# Patient Record
Sex: Female | Born: 1990 | Race: Black or African American | Hispanic: No | Marital: Single | Smoking: Never smoker
Health system: Southern US, Community
[De-identification: ages and names within clinical notes are randomized; demographics above are authoritative.]

## PROBLEM LIST (undated history)

## (undated) DIAGNOSIS — Z789 Other specified health status: Secondary | ICD-10-CM

## (undated) HISTORY — PX: SMALL INTESTINE SURGERY: SHX150

---
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
## 2013-09-27 ENCOUNTER — Emergency Department: Payer: Self-pay | Admitting: Emergency Medicine

## 2015-02-14 ENCOUNTER — Ambulatory Visit (INDEPENDENT_AMBULATORY_CARE_PROVIDER_SITE_OTHER): Payer: No Typology Code available for payment source

## 2015-02-14 ENCOUNTER — Ambulatory Visit (INDEPENDENT_AMBULATORY_CARE_PROVIDER_SITE_OTHER): Payer: No Typology Code available for payment source | Admitting: Podiatry

## 2015-02-14 ENCOUNTER — Encounter: Payer: Self-pay | Admitting: Podiatry

## 2015-02-14 VITALS — Ht 67.0 in | Wt 165.0 lb

## 2015-02-14 DIAGNOSIS — M2040 Other hammer toe(s) (acquired), unspecified foot: Secondary | ICD-10-CM

## 2015-02-14 DIAGNOSIS — M205X9 Other deformities of toe(s) (acquired), unspecified foot: Secondary | ICD-10-CM

## 2015-02-14 DIAGNOSIS — M21629 Bunionette of unspecified foot: Secondary | ICD-10-CM

## 2015-02-14 NOTE — Patient Instructions (Signed)
Hammer Toes Hammer toes is a condition in which one or more of your toes is permanently flexed. CAUSES  This happens when a muscle imbalance or abnormal bone length makes your small toes buckle. This causes the toe joint to contract and the strong cord-like bands that attach muscles to the bones (tendons) in your toes to shorten.  SIGNS AND SYMPTOMS  Common symptoms of flexible hammer toes include:   A buildup of skin cells (corns). Corns occur where boney bumps come in frequent contact with hard surfaces. For example, where your shoes press and rub.  Irritation.  Inflammation.  Pain.  Limited motion in your toes. DIAGNOSIS  Hammer toes are diagnosed through a physical exam of your toes. During the exam, your health care provider may try to reproduce your symptoms by manipulating your foot. Often, X-ray exams are done to determine the degree of deformity and to make sure that the cause is not a fracture.  TREATMENT  Hammer toes can be treated with corrective surgery. There are several types of surgical procedures that can treat hammer toes. The most common procedures include:  Arthroplasty--A portion of the joint is surgically removed and your toe is straightened. The gap fills in with fibrous tissue. This procedure helps treat pain and deformity and helps restore function.  Fusion--Cartilage between the two bones of the affected joint is taken out and the bones fuse together into one longer bone. This helps keep your toe stable and reduces pain but leaves your toe stiff, yet straight.  Implantation--A portion of your bone is removed and replaced with an implant to restore motion.  Flexor tendon transfers--This procedure repositions the tendons that curl the toes down (flexor tendons). This may be done to release the deforming force that causes your toe to buckle. Several of these procedures require fixing your toe with a pin that is visible at the tip of your toe. The pin keeps the toe  straight during healing. Your health care provider will remove the pin usually within 4-8 weeks after the procedure.  Document Released: 09/27/2000 Document Revised: 10/05/2013 Document Reviewed: 06/07/2013 [employer] Patient Information 2015 [employer]. This information is not intended to replace advice given to you by your health care provider. Make sure you discuss any questions you have with your health care provider.  

## 2015-02-14 NOTE — Progress Notes (Signed)
Subjective:     Patient ID: Regina Fischer, female   DOB: June 13, 1991, 24 y.o.   MRN: [id number]  HPI 24 year old female presented the office today with complaints of painful "corns" to the top of her toes on both feet. She also states she has a painful bump on the outside of her foot behind the little toe. She states she has pain to the areas particularly with shoe gear. She said no prior treatment. Denies any history of injury or trauma. Denies any swelling or redness. Denies any numbness or tingling. Review of Systems  All other systems reviewed and are negative.      Objective:   Physical Exam AAO x3, NAD DP/PT pulses palpable bilaterally, CRT less than 3 seconds Protective sensation intact with Simms Weinstein monofilament, vibratory sensation intact, Achilles tendon reflex intact There are hammertoe contractures bilateral lesser digits of 2 through 5 with adductovarus of the third, fourth, fifth digits. The contractures. Be more prominent on the right side than the left side. Upon weightbearing the hammertoe contractures do straighten somewhat on the left side however they do remain on the right side. There is hyperpigmented areas on the dorsal aspect of the PIPJ and the PIPJ 23 and 4 and along the PIPJ of the fifth digit. These areas appear to be from shoe irritation. She states these areas of the areas of tenderness particularly shoes. She also has tenderness over the site of a tailors' bunion with the right  Worse then the left. There is no areas of pinpoint bony tenderness or pain the vibratory sensation.  No areas of tenderness to bilateral lower extremities. MMT 5/5, ROM WNL.  No open lesions or pre-ulcerative lesions.  No overlying edema, erythema, increase in warmth to bilateral lower extremities.  No pain with calf compression, swelling, warmth, erythema bilaterally.      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    Plan:     -X-rays were obtained and reviewed with the patient. -Treatment options were discussed include alternatives, risks, complications. I discussed both conservative and surgical options. -Conservative treatment discussed with patient include sugar modifications, will higher toe box, padding, offloading. She will try these treatments first before proceed with surgical intervention. -I discussed with her surgical intervention of the digits as well as tailors bunionectomy. Discussed the patient postoperative course. -Follow-up as needed. Call the office with any questions, concerns, change in symptoms.

## 2015-02-21 NOTE — H&P (Signed)
L&D Evaluation:  History Expanded:   HPI 24 yo G1P0 at 40 weeks for delivery, [facility].    Gravida 1    Term 0    Blood Type O positive    Group B Strep Results (Result >5wks must be treated as unknown) positive    Patient's Medical History No Chronic Illness    Patient's Surgical History Bowel surgery as infant    Medications Pre Natal Vitamins    Allergies NKDA    Social History none    Family History Non-Contributory   ROS:   ROS All systems were reviewed.  HEENT, CNS, GI, GU, Respiratory, CV, Renal and Musculoskeletal systems were found to be normal.   Exam:   Vital Signs stable    General no apparent distress    Mental Status clear    Chest clear    Heart normal sinus rhythm, no murmur/gallop/rubs    Abdomen gravid, non-tender    Estimated Fetal Weight Average for gestational age    Back no CVAT    Edema no edema    Pelvic no external lesions, 4/80    Mebranes Intact    FHT normal rate with no decels    Ucx regular    Skin dry   Impression:   Impression early labor   Plan:   Plan EFM/NST, monitor contractions and for cervical change, antibiotics for GBBS prophylaxis, fluids    Comments Plan AROM after ABX in   Electronic Signatures: Letitia LibraHarris, Cadan Maggart Paul (MD)  (Signed [id number]-Feb-13 15:14)  Authored: L&D Evaluation   Last Updated: 22-Feb-13 15:14 by Letitia LibraHarris, Tom Macpherson Paul (MD)

## 2015-08-27 ENCOUNTER — Encounter: Payer: Self-pay | Admitting: Emergency Medicine

## 2015-08-27 ENCOUNTER — Emergency Department
Admission: EM | Admit: 2015-08-27 | Discharge: 2015-08-27 | Disposition: A | Discharge status: Home / Self Care | Payer: No Typology Code available for payment source | Attending: Emergency Medicine | Admitting: Emergency Medicine

## 2015-08-27 ENCOUNTER — Emergency Department: Payer: No Typology Code available for payment source

## 2015-08-27 DIAGNOSIS — Y9389 Activity, other specified: Secondary | ICD-10-CM | POA: Insufficient documentation

## 2015-08-27 DIAGNOSIS — S93402A Sprain of unspecified ligament of left ankle, initial encounter: Secondary | ICD-10-CM | POA: Insufficient documentation

## 2015-08-27 DIAGNOSIS — Y998 Other external cause status: Secondary | ICD-10-CM | POA: Diagnosis not present

## 2015-08-27 DIAGNOSIS — Y92410 Unspecified street and highway as the place of occurrence of the external cause: Secondary | ICD-10-CM | POA: Insufficient documentation

## 2015-08-27 DIAGNOSIS — S99912A Unspecified injury of left ankle, initial encounter: Secondary | ICD-10-CM | POA: Diagnosis present

## 2015-08-27 DIAGNOSIS — S161XXA Strain of muscle, fascia and tendon at neck level, initial encounter: Secondary | ICD-10-CM | POA: Diagnosis not present

## 2015-08-27 MED ORDER — METHOCARBAMOL 500 MG PO TABS
1000.0000 mg | ORAL_TABLET | Freq: Once | ORAL | Status: AC
  Administered 2015-08-27: 1000 mg via ORAL
  Filled 2015-08-27: qty 2

## 2015-08-27 MED ORDER — IBUPROFEN 800 MG PO TABS
800.0000 mg | ORAL_TABLET | Freq: Once | ORAL | Status: AC
  Administered 2015-08-27: 800 mg via ORAL
  Filled 2015-08-27: qty 1

## 2015-08-27 MED ORDER — IBUPROFEN 800 MG PO TABS: 800.0000 mg | ORAL_TABLET | Freq: Three times a day (TID) | ORAL | Status: DC | PRN

## 2015-08-27 MED ORDER — METHOCARBAMOL 750 MG PO TABS: 750.0000 mg | ORAL_TABLET | Freq: Four times a day (QID) | ORAL | Status: DC
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
## 2015-08-27 NOTE — Discharge Instructions (Signed)
For ankle support for 3-5 days as needed.

## 2015-10-15 NOTE — L&D Delivery Note (Addendum)
Delivery Note At 10:49 AM a viable and healthy female "Regina Fischer" was delivered via Vaginal, Spontaneous Delivery (Presentation: LOA ).  APGAR: 8, 9; weight  .   Placenta status: spontaneous and 3V Cord:  with the following complications: short cord  Anesthesia:  epidural Episiotomy:  none Lacerations:  Bilateral labial that required no stiched Est. Blood Loss (mL):  300  Mom to postpartum.  Baby to Couplet care / Skin to Skin.  She was admitted in active labor. SROM in early morning for clear fluid. Intact perineum, no concerns, beautiful delivery. Baby with short cord so delayed clamping at perineum and FOB cut the cord. Baby crying and vigorous and up to maternal abdomen.  Christeen DouglasBEASLEY, Regina Fischer 09/18/2016, 11:02 AM

## 2016-01-30 ENCOUNTER — Encounter: Payer: Self-pay | Admitting: Emergency Medicine

## 2016-01-30 ENCOUNTER — Emergency Department
Admission: EM | Admit: 2016-01-30 | Discharge: 2016-01-30 | Disposition: A | Discharge status: Home / Self Care | Payer: BLUE CROSS/BLUE SHIELD | Attending: Emergency Medicine | Admitting: Emergency Medicine

## 2016-01-30 ENCOUNTER — Emergency Department: Payer: BLUE CROSS/BLUE SHIELD

## 2016-01-30 DIAGNOSIS — Z3A01 Less than 8 weeks gestation of pregnancy: Secondary | ICD-10-CM | POA: Insufficient documentation

## 2016-01-30 DIAGNOSIS — O26891 Other specified pregnancy related conditions, first trimester: Secondary | ICD-10-CM | POA: Insufficient documentation

## 2016-01-30 DIAGNOSIS — Z3490 Encounter for supervision of normal pregnancy, unspecified, unspecified trimester: Secondary | ICD-10-CM

## 2016-01-30 DIAGNOSIS — R1030 Lower abdominal pain, unspecified: Secondary | ICD-10-CM | POA: Insufficient documentation

## 2016-01-30 LAB — POCT PREGNANCY, URINE: PREG TEST UR: POSITIVE — AB

## 2016-01-30 LAB — HCG, QUANTITATIVE, PREGNANCY: HCG, BETA CHAIN, QUANT, S: 54202 m[IU]/mL — AB (ref <5)

## 2016-01-30 LAB — CBC WITH DIFFERENTIAL/PLATELET
BASOS ABS: 0 10*3/uL (ref 0–0.1)
BASOS PCT: 0 %
Eosinophils Absolute: 0.1 10*3/uL (ref 0–0.7)
Eosinophils Relative: 1 %
HEMATOCRIT: 36.3 % (ref 35.0–47.0)
HEMOGLOBIN: 12.2 g/dL (ref 12.0–16.0)
Lymphocytes Relative: 37 %
Lymphs Abs: 1.9 10*3/uL (ref 1.0–3.6)
MCH: 28.9 pg (ref 26.0–34.0)
MCHC: 33.5 g/dL (ref 32.0–36.0)
MCV: 86.4 fL (ref 80.0–100.0)
MONO ABS: 0.3 10*3/uL (ref 0.2–0.9)
Monocytes Relative: 7 %
Neutro Abs: 2.8 10*3/uL (ref 1.4–6.5)
Neutrophils Relative %: 55 %
Platelets: 186 10*3/uL (ref 150–440)
RBC: 4.2 MIL/uL (ref 3.80–5.20)
RDW: 13.6 % (ref 11.5–14.5)
WBC: 5.1 10*3/uL (ref 3.6–11.0)

## 2016-01-30 MED ORDER — ACETAMINOPHEN 325 MG PO TABS
650.0000 mg | ORAL_TABLET | Freq: Once | ORAL | Status: AC
  Administered 2016-01-30: 650 mg via ORAL
  Filled 2016-01-30: qty 2

## 2016-01-30 NOTE — ED Notes (Signed)

## 2016-01-30 NOTE — ED Notes (Signed)
Reports [redacted] wks pregnant, abd cramping.  No bleeding.

## 2016-01-30 NOTE — ED Provider Notes (Signed)
Time Seen: Approximately 1850  I have reviewed the triage notes  Chief Complaint: Abdominal Cramping   History of Present Illness: Regina Fischer is a 25 y.o. female who is now gravida 2 para 1 and was recently had a positive pregnancy test. Patient states that she's been having some lower abdominal cramping without any vaginal discharge or bleeding. Had any prenatal evaluation up to this point. She denies any nausea, vomiting, fever. She denies any dysuria, hematuria, urinary frequency   History reviewed. No pertinent past medical history.  There are no active problems to display for this patient.   History reviewed. No pertinent past surgical history.  History reviewed. No pertinent past surgical history.  Current Outpatient Rx  Name  Route  Sig  Dispense  Refill  . ibuprofen (ADVIL,MOTRIN) 800 MG tablet   Oral   Take 1 tablet (800 mg total) by mouth every 8 (eight) hours as needed for moderate pain.   15 tablet   0   . methocarbamol (ROBAXIN-750) 750 MG tablet   Oral   Take 1 tablet (750 mg total) by mouth 4 (four) times daily.   20 tablet   0     Allergies:  Review of patient's allergies indicates no known allergies.  Family History: No family history on file.  Social History: Social History  Substance Use Topics  . Smoking status: Never Smoker   . Smokeless tobacco: None  . Alcohol Use: No     Review of Systems:   10 point review of systems was performed and was otherwise negative:  Constitutional: No fever Eyes: No visual disturbances ENT: No sore throat, ear pain Cardiac: No chest pain Respiratory: No shortness of breath, wheezing, or stridor Abdomen: Mild lower middle quadrant abdominal pain Endocrine: No weight loss, No night sweats Extremities: No peripheral edema, cyanosis Skin: No rashes, easy bruising Neurologic: No focal weakness, trouble with speech or swollowing Urologic: No dysuria, Hematuria, or urinary frequency   Physical  Exam:  ED Triage Vitals  Enc Vitals Group     BP 01/30/16 1756 123/66 mmHg     Pulse Rate 01/30/16 1756 60     Resp 01/30/16 1756 16     Temp 01/30/16 1756 98.6 F (37 C)     Temp Source 01/30/16 1756 Oral     SpO2 01/30/16 1756 100 %     Weight 01/30/16 1756 175 lb (79.379 kg)     Height 01/30/16 1756  (1.702 m)     Head Cir --      Peak Flow --      Pain Score 01/30/16 1757 4     Pain Loc --      Pain Edu? --      Excl. in GC? --     General: Awake , Alert , and Oriented times 3; GCS 15 Head: Normal cephalic , atraumatic Eyes: Pupils equal , round, reactive to light Nose/Throat: No nasal drainage, patent upper airway without erythema or exudate.  Neck: Supple, Full range of motion, No anterior adenopathy or palpable thyroid masses Lungs: Clear to ascultation without wheezes , rhonchi, or rales Heart: Regular rate, regular rhythm without murmurs , gallops , or rubs Abdomen: Soft, non tender without rebound, guarding , or rigidity; bowel sounds positive and symmetric in all 4 quadrants. No organomegaly .        Extremities: 2 plus symmetric pulses. No edema, clubbing or cyanosis Neurologic: normal ambulation, Motor symmetric without deficits, sensory intact Skin: warm,  dry, no rashes   Labs:   All laboratory work was reviewed including any pertinent negatives or positives listed below:  Labs Reviewed  HCG, QUANTITATIVE, PREGNANCY - Abnormal; Notable for the following:    hCG, Beta Chain, Quant, S [id number] (*)    All other components within normal limits  POCT PREGNANCY, URINE - Abnormal; Notable for the following:    Preg Test, Ur POSITIVE (*)    All other components within normal limits  CBC WITH DIFFERENTIAL/PLATELET  POC URINE PREG, ED  ABO/RH  Laboratory work was reviewed and showed no clinically significant abnormalities.   Radiology: *  EXAM: OBSTETRIC <14 WK US AND TRANSVAGINAL OB US  TECHNIQUE: Both transabdominal and transvaginal ultrasound  examinations were performed for complete evaluation of the gestation as well as the maternal uterus, adnexal regions, and pelvic cul-de-sac. Transvaginal technique was performed to assess early pregnancy.  COMPARISON: None.  FINDINGS: Intrauterine gestational sac: Single.  Yolk sac: Visualized.  Embryo: Visualized.  Cardiac Activity: Visualized.  Heart Rate: 115 bpm  CRL: 9.4 mm  7 w  0 d         US EDC: September 17, 2016.  Subchorionic hemorrhage: None visualized.  Maternal uterus/adnexae: Ovaries appear normal. No free fluid is noted.  IMPRESSION: Single live intrauterine gestation of 7 weeks 0 days.   Electronically Signed  I personally reviewed the radiologic studies   P ED Course: Patient's stay was uneventful and her workup was primarily around making sure her pregnancies in the right location. No evidence on clinical exam and ultrasound of an ectopic pregnancy at this time. Patient was advised over results and follow up with [facility] which [facility]. Return here if she develops a fever, persistent vomiting, vaginal bleeding or any other new concerns. Tylenol for pain and drink plenty of fluids   Assessment: * First trimester pregnancy   Final Clinical Impression:   Final diagnoses:  Pregnancy     Plan:  Outpatient Patient was advised to return immediately if condition worsens. Patient was advised to follow up with their primary care physician or other specialized physicians involved in their outpatient care. The patient and/or family member/power of attorney had laboratory results reviewed at the bedside. All questions and concerns were addressed and appropriate discharge instructions were distributed by the nursing staff.             Jennye MoccasinBrian S Quigley, MD 01/30/16 2016

## 2016-01-30 NOTE — Discharge Instructions (Signed)
First Trimester of Pregnancy °The first trimester of pregnancy is from week 1 until the end of week 12 (months 1 through 3). A week after a sperm fertilizes an egg, the egg will implant on the wall of the uterus. This embryo will begin to develop into a baby. Genes from you and your partner are forming the baby. The female genes determine whether the baby is a boy or a girl. At 6-8 weeks, the eyes and face are formed, and the heartbeat can be seen on ultrasound. At the end of 12 weeks, all the baby's organs are formed.  °Now that you are pregnant, you will want to do everything you can to have a healthy baby. Two of the most important things are to get good prenatal care and to follow your health care provider's instructions. Prenatal care is all the medical care you receive before the baby's birth. This care will help prevent, find, and treat any problems during the pregnancy and childbirth. °BODY CHANGES °Your body goes through many changes during pregnancy. The changes vary from woman to woman.  °· You may gain or lose a couple of pounds at first. °· You may feel sick to your stomach (nauseous) and throw up (vomit). If the vomiting is uncontrollable, call your health care provider. °· You may tire easily. °· You may develop headaches that can be relieved by medicines approved by your health care provider. °· You may urinate more often. Painful urination may mean you have a bladder infection. °· You may develop heartburn as a result of your pregnancy. °· You may develop constipation because certain hormones are causing the muscles that push waste through your intestines to slow down. °· You may develop hemorrhoids or swollen, bulging veins (varicose veins). °· Your breasts may begin to grow larger and become tender. Your nipples may stick out more, and the tissue that surrounds them (areola) may become darker. °· Your gums may bleed and may be sensitive to brushing and flossing. °· Dark spots or blotches (chloasma,  mask of pregnancy) may develop on your face. This will likely fade after the baby is born. °· Your menstrual periods will stop. °· You may have a loss of appetite. °· You may develop cravings for certain kinds of food. °· You may have changes in your emotions from day to day, such as being excited to be pregnant or being concerned that something may go wrong with the pregnancy and baby. °· You may have more vivid and strange dreams. °· You may have changes in your hair. These can include thickening of your hair, rapid growth, and changes in texture. Some women also have hair loss during or after pregnancy, or hair that feels dry or thin. Your hair will most likely return to normal after your baby is born. °WHAT TO EXPECT AT YOUR PRENATAL VISITS °During a routine prenatal visit: °· You will be weighed to make sure you and the baby are growing normally. °· Your blood pressure will be taken. °· Your abdomen will be measured to track your baby's growth. °· The fetal heartbeat will be listened to starting around week 10 or 12 of your pregnancy. °· Test results from any previous visits will be discussed. °Your health care provider may ask you: °· How you are feeling. °· If you are feeling the baby move. °· If you have had any abnormal symptoms, such as leaking fluid, bleeding, severe headaches, or abdominal cramping. °· If you are using any tobacco products,   including cigarettes, chewing tobacco, and electronic cigarettes. °· If you have any questions. °Other tests that may be performed during your first trimester include: °· Blood tests to find your blood type and to check for the presence of any previous infections. They will also be used to check for low iron levels (anemia) and Rh antibodies. Later in the pregnancy, blood tests for diabetes will be done along with other tests if problems develop. °· Urine tests to check for infections, diabetes, or protein in the urine. °· An ultrasound to confirm the proper growth  and development of the baby. °· An amniocentesis to check for possible genetic problems. °· Fetal screens for spina bifida and Down syndrome. °· You may need other tests to make sure you and the baby are doing well. °· HIV (human immunodeficiency virus) testing. Routine prenatal testing includes screening for HIV, unless you choose not to have this test. °HOME CARE INSTRUCTIONS  °Medicines °· Follow your health care provider's instructions regarding medicine use. Specific medicines may be either safe or unsafe to take during pregnancy. °· Take your prenatal vitamins as directed. °· If you develop constipation, try taking a stool softener if your health care provider approves. °Diet °· Eat regular, well-balanced meals. Choose a variety of foods, such as meat or vegetable-based protein, fish, milk and low-fat dairy products, vegetables, fruits, and whole grain breads and cereals. Your health care provider will help you determine the amount of weight gain that is right for you. °· Avoid raw meat and uncooked cheese. These carry germs that can cause birth defects in the baby. °· Eating four or five small meals rather than three large meals a day may help relieve nausea and vomiting. If you start to feel nauseous, eating a few soda crackers can be helpful. Drinking liquids between meals instead of during meals also seems to help nausea and vomiting. °· If you develop constipation, eat more high-fiber foods, such as fresh vegetables or fruit and whole grains. Drink enough fluids to keep your urine clear or pale yellow. °Activity and Exercise °· Exercise only as directed by your health care provider. Exercising will help you: °¨ Control your weight. °¨ Stay in shape. °¨ Be prepared for labor and delivery. °· Experiencing pain or cramping in the lower abdomen or low back is a good sign that you should stop exercising. Check with your health care provider before continuing normal exercises. °· Try to avoid standing for long  periods of time. Move your legs often if you must stand in one place for a long time. °· Avoid heavy lifting. °· Wear low-heeled shoes, and practice good posture. °· You may continue to have sex unless your health care provider directs you otherwise. °Relief of Pain or Discomfort °· Wear a good support bra for breast tenderness.   °· Take warm sitz baths to soothe any pain or discomfort caused by hemorrhoids. Use hemorrhoid cream if your health care provider approves.   °· Rest with your legs elevated if you have leg cramps or low back pain. °· If you develop varicose veins in your legs, wear support hose. Elevate your feet for 15 minutes, 3-4 times a day. Limit salt in your diet. °Prenatal Care °· Schedule your prenatal visits by the twelfth week of pregnancy. They are usually scheduled monthly at first, then more often in the last 2 months before delivery. °· Write down your questions. Take them to your prenatal visits. °· Keep all your prenatal visits as directed by your   health care provider. °Safety °· Wear your seat belt at all times when driving. °· Make a list of emergency phone numbers, including numbers for family, friends, the hospital, and police and fire departments. °General Tips °· Ask your health care provider for a referral to a local prenatal education class. Begin classes no later than at the beginning of month 6 of your pregnancy. °· Ask for help if you have counseling or nutritional needs during pregnancy. Your health care provider can offer advice or refer you to specialists for help with various needs. °· Do not use hot tubs, steam rooms, or saunas. °· Do not douche or use tampons or scented sanitary pads. °· Do not cross your legs for long periods of time. °· Avoid cat litter boxes and soil used by cats. These carry germs that can cause birth defects in the baby and possibly loss of the fetus by miscarriage or stillbirth. °· Avoid all smoking, herbs, alcohol, and medicines not prescribed by  your health care provider. Chemicals in these affect the formation and growth of the baby. °· Do not use any tobacco products, including cigarettes, chewing tobacco, and electronic cigarettes. If you need help quitting, ask your health care provider. You may receive counseling support and other resources to help you quit. °· Schedule a dentist appointment. At home, brush your teeth with a soft toothbrush and be gentle when you floss. °SEEK MEDICAL CARE IF:  °· You have dizziness. °· You have mild pelvic cramps, pelvic pressure, or nagging pain in the abdominal area. °· You have persistent nausea, vomiting, or diarrhea. °· You have a bad smelling vaginal discharge. °· You have pain with urination. °· You notice increased swelling in your face, hands, legs, or ankles. °SEEK IMMEDIATE MEDICAL CARE IF:  °· You have a fever. °· You are leaking fluid from your vagina. °· You have spotting or bleeding from your vagina. °· You have severe abdominal cramping or pain. °· You have rapid weight gain or loss. °· You vomit blood or material that looks like coffee grounds. °· You are exposed to German measles and have never had them. °· You are exposed to fifth disease or chickenpox. °· You develop a severe headache. °· You have shortness of breath. °· You have any kind of trauma, such as from a fall or a car accident. °  °This information is not intended to replace advice given to you by your health care provider. Make sure you discuss any questions you have with your health care provider. °  °Document Released: 09/24/2001 Document Revised: 10/21/2014 Document Reviewed: 08/10/2013 °Elsevier Interactive Patient Education ©2016 [employer] ° ° °Please return immediately if condition worsens. Please contact her primary physician or the physician you were given for referral. If you have any specialist physicians involved in her treatment and plan please also contact them. Thank you for using [facility]. ° °

## 2016-01-31 LAB — ABO/RH: ABO/RH(D): O POS

## 2016-02-14 ENCOUNTER — Other Ambulatory Visit: Payer: Self-pay | Admitting: Obstetrics and Gynecology

## 2016-02-14 DIAGNOSIS — Z369 Encounter for antenatal screening, unspecified: Secondary | ICD-10-CM

## 2016-03-04 ENCOUNTER — Ambulatory Visit
Admission: RE | Admit: 2016-03-04 | Discharge: 2016-03-04 | Disposition: A | Discharge status: Home / Self Care | Payer: BLUE CROSS/BLUE SHIELD | Source: Ambulatory Visit | Attending: Obstetrics and Gynecology | Admitting: Obstetrics and Gynecology

## 2016-03-04 ENCOUNTER — Ambulatory Visit (HOSPITAL_BASED_OUTPATIENT_CLINIC_OR_DEPARTMENT_OTHER)
Admission: RE | Admit: 2016-03-04 | Discharge: 2016-03-04 | Disposition: A | Discharge status: Home / Self Care | Payer: BLUE CROSS/BLUE SHIELD | Source: Ambulatory Visit

## 2016-03-04 VITALS — BP 130/63 | HR 94 | Temp 97.7°F | Wt 178.0 lb

## 2016-03-04 DIAGNOSIS — Z369 Encounter for antenatal screening, unspecified: Secondary | ICD-10-CM

## 2016-03-04 DIAGNOSIS — Z3481 Encounter for supervision of other normal pregnancy, first trimester: Secondary | ICD-10-CM | POA: Insufficient documentation

## 2016-03-04 DIAGNOSIS — Z36 Encounter for antenatal screening of mother: Secondary | ICD-10-CM

## 2016-03-04 DIAGNOSIS — Z3A12 12 weeks gestation of pregnancy: Secondary | ICD-10-CM | POA: Diagnosis not present

## 2016-03-04 HISTORY — DX: Other specified health status: Z78.9

## 2016-03-04 NOTE — Progress Notes (Addendum)
Referring physician:  [facility] Length of Consultation: 40 minutes   Regina Fischer  was referred to [facility] for genetic counseling to review prenatal screening and testing options.  This note summarizes the information we discussed.    We offered the following routine screening tests for this pregnancy:  First trimester screening, which includes nuchal translucency ultrasound screen and first trimester maternal serum marker screening.  The nuchal translucency has approximately an 80% detection rate for Down syndrome and can be positive for other chromosome abnormalities as well as congenital heart defects.  When combined with a maternal serum marker screening, the detection rate is up to 90% for Down syndrome and up to 97% for trisomy 18.     Maternal serum marker screening, a blood test that measures pregnancy proteins, can provide risk assessments for Down syndrome, trisomy 18, and open neural tube defects (spina bifida, anencephaly). Because it does not directly examine the fetus, it cannot positively diagnose or rule out these problems.  Targeted ultrasound uses high frequency sound waves to create an image of the developing fetus.  An ultrasound is often recommended as a routine means of evaluating the pregnancy.  It is also used to screen for fetal anatomy problems (for example, a heart defect) that might be suggestive of a chromosomal or other abnormality.   Should these screening tests indicate an increased concern, then the following additional testing options would be offered:  The chorionic villus sampling procedure is available for first trimester chromosome analysis.  This involves the withdrawal of a small amount of chorionic villi (tissue from the developing placenta).  Risk of pregnancy loss is estimated to be approximately 1 in 200 to 1 in 100 (0.5 to 1%).  There is approximately a 1% (1 in 100) chance that the CVS chromosome results will be  unclear.  Chorionic villi cannot be tested for neural tube defects.     Amniocentesis involves the removal of a small amount of amniotic fluid from the sac surrounding the fetus with the use of a thin needle inserted through the maternal abdomen and uterus.  Ultrasound guidance is used throughout the procedure.  Fetal cells from amniotic fluid are directly evaluated and > 99.5% of chromosome problems and > 98% of open neural tube defects can be detected. This procedure is generally performed after the 15th week of pregnancy.  The main risks to this procedure include complications leading to miscarriage in less than 1 in 200 cases (0.5%).  As another option for information if the pregnancy is suspected to be an an increased chance for certain chromosome conditions, we also reviewed the availability of cell free fetal DNA testing from maternal blood to determine whether or not the baby may have either Down syndrome, trisomy 12, or trisomy 84.  This test utilizes a maternal blood sample and DNA sequencing technology to isolate circulating cell free fetal DNA from maternal plasma.  The fetal DNA can then be analyzed for DNA sequences that are derived from the three most common chromosomes involved in aneuploidy, chromosomes 13, 18, and 21.  If the overall amount of DNA is greater than the expected level for any of these chromosomes, aneuploidy is suspected.  While we do not consider it a replacement for invasive testing and karyotype analysis, a negative result from this testing would be reassuring, though not a guarantee of a normal chromosome complement for the baby.  An abnormal result is certainly suggestive of an abnormal chromosome complement, though  we would still recommend CVS or amniocentesis to confirm any findings from this testing.  Cystic Fibrosis and Spinal Muscular Atrophy (SMA) screening were also discussed with the patient. Both conditions are recessive, which means that both parents must be  carriers in order to have a child with the disease.  Cystic fibrosis (CF) is one of the most common genetic conditions in persons of Caucasian ancestry.  This condition occurs in approximately 1 in 2,500 Caucasian persons and results in thickened secretions in the lungs, digestive, and reproductive systems.  For a baby to be at risk for having CF, both of the parents must be carriers for this condition.  Approximately 1 in 10325 Caucasian persons is a carrier for CF.  Current carrier testing looks for the most common mutations in the gene for CF and can detect approximately 90% of carriers in the Caucasian population.  This means that the carrier screening can greatly reduce, but cannot eliminate, the chance for an individual to have a child with CF.  If an individual is found to be a carrier for CF, then carrier testing would be available for the partner. As part of [place] newborn screening profile, all babies born in the state of [place] will have a two-tier screening process.  Specimens are first tested to determine the concentration of immunoreactive trypsinogen (IRT).  The top 5% of specimens with the highest IRT values then undergo DNA testing using a panel of over 40 common CF mutations. SMA is a neurodegenerative disorder that leads to atrophy of skeletal muscle and overall weakness.  This condition is also more prevalent in the Caucasian population, with 1 in 40-1 in 60 persons being a carrier and 1 in 6,000-1 in 10,000 children being affected.  There are multiple forms of the disease, with some causing death in infancy to other forms with survival into adulthood.  The genetics of SMA is complex, but carrier screening can detect up to 95% of carriers in the Caucasian population.  Similar to CF, a negative result can greatly reduce, but cannot eliminate, the chance to have a child with SMA.  Because the patient is of African American ancestry, we also reviewed the option of testing for  hemoglobinopathies.   The patient previously had a normal MCV (86) and a negative hemoglobin solubility.  While this reduces the chance that she may be a carrier for sickle cell trait or thalassemia, a hemoglobin electrophoresis should be offered to provide more complete testing for hemoglobinopathies if desired, per ACOG guidelines.  We obtained a detailed family history and pregnancy history.  The family history was reported to be unremarkable for birth defects, mental retardation, recurrent pregnancy loss or known chromosome abnormalities.  Ms. Regina Fischer stated that this is her second pregnancy, the first with her current partner.  She has one healthy 23 year old daughter.  She reported no complications or exposures that would be expected to increase the risk for birth defects.  After consideration of the options, Ms. Regina Fischer elected to proceed with first trimester screening.  An ultrasound was performed at the time of the visit.  The gestational age was consistent with  12 weeks.  Fetal anatomy could not be assessed due to early gestational age.  Please refer to the ultrasound report for details of that study.  Ms. Regina Fischer was encouraged to call with questions or concerns.  We can be contacted at [phone].   Regina Andersoneborah F. Wells, MS, CGC  I was immediately available and supervising.  Camelia Phenes, MD [facility]

## 2016-03-07 ENCOUNTER — Telehealth: Payer: Self-pay | Admitting: Obstetrics and Gynecology

## 2016-03-07 NOTE — Telephone Encounter (Signed)
  Ms. Regina Fischer elected to undergo First Trimester screening on 03/04/2016.  To review, first trimester screening, includes nuchal translucency ultrasound screen and/or first trimester maternal serum marker screening.  The nuchal translucency has approximately an 80% detection rate for Down syndrome and can be positive for other chromosome abnormalities as well as heart defects.  When combined with a maternal serum marker screening, the detection rate is up to 90% for Down syndrome and up to 97% for trisomy 13 and 18.     The results of the First Trimester Nuchal Translucency and Biochemical Screening were within normal range.  The risk for Down syndrome is now estimated to be less than 1 in 10,000.  The risk for Trisomy 13/18 is also estimated to be less than 1 in 10,000.  Should more definitive information be desired, we would offer amniocentesis.  Because we do not yet know the effectiveness of combined first and second trimester screening, we do not recommend a maternal serum screen to assess the chance for chromosome conditions.  However, if screening for neural tube defects is desired, maternal serum screening for AFP only can be performed between 15 and [redacted] weeks gestation.    We can be reached at [phone] with any questions or concerns.  Regina Andersoneborah F. Lailyn Appelbaum, MS, CGC

## 2016-04-22 NOTE — Addendum Note (Signed)
Encounter addended by: Camelia PhenesBrenna L Hughes, MD on: 04/22/2016 11:18 AM<BR>     Documentation filed: Notes Section

## 2016-09-18 ENCOUNTER — Inpatient Hospital Stay: Payer: BLUE CROSS/BLUE SHIELD | Admitting: Certified Registered"

## 2016-09-18 ENCOUNTER — Inpatient Hospital Stay
Admission: EM | Admit: 2016-09-18 | Discharge: 2016-09-20 | DRG: 775 | Disposition: A | Discharge status: Home / Self Care | Payer: BLUE CROSS/BLUE SHIELD | Attending: Obstetrics and Gynecology | Admitting: Obstetrics and Gynecology

## 2016-09-18 ENCOUNTER — Encounter: Payer: Self-pay | Admitting: *Deleted

## 2016-09-18 DIAGNOSIS — Z3493 Encounter for supervision of normal pregnancy, unspecified, third trimester: Secondary | ICD-10-CM | POA: Diagnosis present

## 2016-09-18 DIAGNOSIS — O99824 Streptococcus B carrier state complicating childbirth: Secondary | ICD-10-CM | POA: Diagnosis present

## 2016-09-18 DIAGNOSIS — O9902 Anemia complicating childbirth: Secondary | ICD-10-CM | POA: Diagnosis present

## 2016-09-18 DIAGNOSIS — D509 Iron deficiency anemia, unspecified: Secondary | ICD-10-CM | POA: Diagnosis present

## 2016-09-18 DIAGNOSIS — Z369 Encounter for antenatal screening, unspecified: Secondary | ICD-10-CM

## 2016-09-18 DIAGNOSIS — K219 Gastro-esophageal reflux disease without esophagitis: Secondary | ICD-10-CM | POA: Diagnosis present

## 2016-09-18 DIAGNOSIS — O9962 Diseases of the digestive system complicating childbirth: Secondary | ICD-10-CM | POA: Diagnosis present

## 2016-09-18 DIAGNOSIS — Z3A40 40 weeks gestation of pregnancy: Secondary | ICD-10-CM | POA: Diagnosis not present

## 2016-09-18 DIAGNOSIS — Z833 Family history of diabetes mellitus: Secondary | ICD-10-CM | POA: Diagnosis not present

## 2016-09-18 LAB — CBC
HCT: 32.7 % — ABNORMAL LOW (ref 35.0–47.0)
HEMOGLOBIN: 11 g/dL — AB (ref 12.0–16.0)
MCH: 27.2 pg (ref 26.0–34.0)
MCHC: 33.7 g/dL (ref 32.0–36.0)
MCV: 80.6 fL (ref 80.0–100.0)
PLATELETS: 191 10*3/uL (ref 150–440)
RBC: 4.05 MIL/uL (ref 3.80–5.20)
RDW: 13.8 % (ref 11.5–14.5)
WBC: 8 10*3/uL (ref 3.6–11.0)

## 2016-09-18 LAB — TYPE AND SCREEN
ABO/RH(D): O POS
ANTIBODY SCREEN: NEGATIVE

## 2016-09-18 LAB — CHLAMYDIA/NGC RT PCR (ARMC ONLY)
CHLAMYDIA TR: NOT DETECTED
N gonorrhoeae: NOT DETECTED

## 2016-09-18 MED ORDER — ZOLPIDEM TARTRATE 5 MG PO TABS
5.0000 mg | ORAL_TABLET | Freq: Every evening | ORAL | Status: DC | PRN
Start: 2016-09-18 — End: 2016-09-20

## 2016-09-18 MED ORDER — COCONUT OIL OIL: 1.0000 "application " | TOPICAL_OIL | Status: DC | PRN

## 2016-09-18 MED ORDER — BUTORPHANOL TARTRATE 1 MG/ML IJ SOLN
1.0000 mg | INTRAMUSCULAR | Status: DC | PRN
  Administered 2016-09-18: 1 mg via INTRAVENOUS
  Filled 2016-09-18: qty 1

## 2016-09-18 MED ORDER — ACETAMINOPHEN 325 MG PO TABS: 650.0000 mg | ORAL_TABLET | ORAL | Status: DC | PRN

## 2016-09-18 MED ORDER — SENNOSIDES-DOCUSATE SODIUM 8.6-50 MG PO TABS
2.0000 | ORAL_TABLET | ORAL | Status: DC
  Administered 2016-09-20: 2 via ORAL
  Filled 2016-09-18 (×2): qty 2

## 2016-09-18 MED ORDER — SODIUM CHLORIDE 0.9 % IV SOLN
1.0000 g | INTRAVENOUS | Status: DC
  Administered 2016-09-18: 1 g via INTRAVENOUS
  Filled 2016-09-18: qty 1000

## 2016-09-18 MED ORDER — LACTATED RINGERS IV SOLN
500.0000 mL | INTRAVENOUS | Status: DC | PRN
Start: 2016-09-18 — End: 2016-09-18
  Administered 2016-09-18: 1000 mL via INTRAVENOUS

## 2016-09-18 MED ORDER — SODIUM CHLORIDE 0.9 % IV SOLN: 250.0000 mL | INTRAVENOUS | Status: DC | PRN

## 2016-09-18 MED ORDER — OXYTOCIN 10 UNIT/ML IJ SOLN
INTRAMUSCULAR | Status: AC
  Filled 2016-09-18: qty 2

## 2016-09-18 MED ORDER — DIPHENHYDRAMINE HCL 25 MG PO CAPS: 25.0000 mg | ORAL_CAPSULE | Freq: Four times a day (QID) | ORAL | Status: DC | PRN

## 2016-09-18 MED ORDER — AMMONIA AROMATIC IN INHA
RESPIRATORY_TRACT | Status: AC
  Filled 2016-09-18: qty 10

## 2016-09-18 MED ORDER — PRENATAL MULTIVITAMIN CH
1.0000 | ORAL_TABLET | Freq: Every day | ORAL | Status: DC
  Administered 2016-09-19: 1 via ORAL
  Filled 2016-09-18: qty 1

## 2016-09-18 MED ORDER — DIBUCAINE 1 % RE OINT: 1.0000 "application " | TOPICAL_OINTMENT | RECTAL | Status: DC | PRN

## 2016-09-18 MED ORDER — SODIUM CHLORIDE 0.9% FLUSH: 3.0000 mL | Freq: Two times a day (BID) | INTRAVENOUS | Status: DC

## 2016-09-18 MED ORDER — SODIUM CHLORIDE 0.9% FLUSH: 3.0000 mL | INTRAVENOUS | Status: DC | PRN

## 2016-09-18 MED ORDER — TETANUS-DIPHTH-ACELL PERTUSSIS 5-2.5-18.5 LF-MCG/0.5 IM SUSP
0.5000 mL | Freq: Once | INTRAMUSCULAR | Status: DC
  Filled 2016-09-18: qty 0.5

## 2016-09-18 MED ORDER — SOD CITRATE-CITRIC ACID 500-334 MG/5ML PO SOLN: 30.0000 mL | ORAL | Status: DC | PRN

## 2016-09-18 MED ORDER — ONDANSETRON HCL 4 MG/2ML IJ SOLN: 4.0000 mg | Freq: Four times a day (QID) | INTRAMUSCULAR | Status: DC | PRN

## 2016-09-18 MED ORDER — FENTANYL 2.5 MCG/ML W/ROPIVACAINE 0.2% IN NS 100 ML EPIDURAL INFUSION (ARMC-ANES)
EPIDURAL | Status: DC | PRN
  Administered 2016-09-18: 9 mL/h via EPIDURAL

## 2016-09-18 MED ORDER — LIDOCAINE-EPINEPHRINE (PF) 1.5 %-1:200000 IJ SOLN
INTRAMUSCULAR | Status: DC | PRN
  Administered 2016-09-18: 3 mL via EPIDURAL

## 2016-09-18 MED ORDER — WITCH HAZEL-GLYCERIN EX PADS: 1.0000 "application " | MEDICATED_PAD | CUTANEOUS | Status: DC | PRN

## 2016-09-18 MED ORDER — OXYTOCIN 40 UNITS IN LACTATED RINGERS INFUSION - SIMPLE MED
1.0000 m[IU]/min | INTRAVENOUS | Status: DC
  Administered 2016-09-18: 1 m[IU]/min via INTRAVENOUS
  Filled 2016-09-18: qty 1000

## 2016-09-18 MED ORDER — IBUPROFEN 600 MG PO TABS
ORAL_TABLET | ORAL | Status: AC
  Administered 2016-09-18: 600 mg via ORAL
  Filled 2016-09-18: qty 1

## 2016-09-18 MED ORDER — OXYTOCIN BOLUS FROM INFUSION
500.0000 mL | Freq: Once | INTRAVENOUS | Status: AC
  Administered 2016-09-18: 500 mL via INTRAVENOUS

## 2016-09-18 MED ORDER — MEASLES, MUMPS & RUBELLA VAC ~~LOC~~ INJ
0.5000 mL | INJECTION | Freq: Once | SUBCUTANEOUS | Status: DC
  Filled 2016-09-18: qty 0.5

## 2016-09-18 MED ORDER — SIMETHICONE 80 MG PO CHEW
80.0000 mg | CHEWABLE_TABLET | ORAL | Status: DC | PRN
Start: 2016-09-18 — End: 2016-09-20

## 2016-09-18 MED ORDER — MISOPROSTOL 200 MCG PO TABS
ORAL_TABLET | ORAL | Status: AC
  Filled 2016-09-18: qty 4

## 2016-09-18 MED ORDER — OXYTOCIN 40 UNITS IN LACTATED RINGERS INFUSION - SIMPLE MED
2.5000 [IU]/h | INTRAVENOUS | Status: DC
  Administered 2016-09-18: 2.5 [IU]/h via INTRAVENOUS

## 2016-09-18 MED ORDER — AMPICILLIN SODIUM 2 G IJ SOLR
2.0000 g | Freq: Once | INTRAMUSCULAR | Status: AC
  Administered 2016-09-18: 2 g via INTRAVENOUS
  Filled 2016-09-18: qty 2000

## 2016-09-18 MED ORDER — BUPIVACAINE HCL (PF) 0.25 % IJ SOLN
INTRAMUSCULAR | Status: DC | PRN
  Administered 2016-09-18: 3 mL via EPIDURAL
  Administered 2016-09-18: 5 mL via EPIDURAL

## 2016-09-18 MED ORDER — LIDOCAINE HCL (PF) 1 % IJ SOLN
INTRAMUSCULAR | Status: DC | PRN
  Administered 2016-09-18: 3 mL via SUBCUTANEOUS

## 2016-09-18 MED ORDER — BISACODYL 10 MG RE SUPP: 10.0000 mg | Freq: Every day | RECTAL | Status: DC | PRN

## 2016-09-18 MED ORDER — LACTATED RINGERS IV SOLN
INTRAVENOUS | Status: DC
  Administered 2016-09-18: 125 mL/h via INTRAVENOUS
  Administered 2016-09-18: 09:00:00 via INTRAVENOUS

## 2016-09-18 MED ORDER — LIDOCAINE HCL (PF) 1 % IJ SOLN
30.0000 mL | INTRAMUSCULAR | Status: DC | PRN
Start: 2016-09-18 — End: 2016-09-18
  Filled 2016-09-18: qty 30

## 2016-09-18 MED ORDER — ONDANSETRON HCL 4 MG PO TABS: 4.0000 mg | ORAL_TABLET | ORAL | Status: DC | PRN

## 2016-09-18 MED ORDER — FLEET ENEMA 7-19 GM/118ML RE ENEM: 1.0000 | ENEMA | Freq: Every day | RECTAL | Status: DC | PRN

## 2016-09-18 MED ORDER — ONDANSETRON HCL 4 MG/2ML IJ SOLN: 4.0000 mg | INTRAMUSCULAR | Status: DC | PRN

## 2016-09-18 MED ORDER — IBUPROFEN 600 MG PO TABS
600.0000 mg | ORAL_TABLET | Freq: Four times a day (QID) | ORAL | Status: DC
  Administered 2016-09-18 – 2016-09-19 (×2): 600 mg via ORAL
  Filled 2016-09-18 (×2): qty 1

## 2016-09-18 MED ORDER — FENTANYL 2.5 MCG/ML W/ROPIVACAINE 0.2% IN NS 100 ML EPIDURAL INFUSION (ARMC-ANES)
EPIDURAL | Status: AC
  Filled 2016-09-18: qty 100

## 2016-09-18 MED ORDER — BENZOCAINE-MENTHOL 20-0.5 % EX AERO: 1.0000 "application " | INHALATION_SPRAY | CUTANEOUS | Status: DC | PRN

## 2016-09-18 MED ORDER — TERBUTALINE SULFATE 1 MG/ML IJ SOLN: 0.2500 mg | Freq: Once | INTRAMUSCULAR | Status: DC | PRN

## 2016-09-18 NOTE — Anesthesia Preprocedure Evaluation (Signed)
Anesthesia Evaluation  Patient identified by MRN, date of birth, ID band Patient awake    Reviewed: Allergy & Precautions, H&P , NPO status , Patient's Chart, lab work & pertinent test results  Airway Mallampati: III  TM Distance: >3 FB Neck ROM: full    Dental no notable dental hx.    Pulmonary neg pulmonary ROS,    Pulmonary exam normal        Cardiovascular negative cardio ROS Normal cardiovascular exam     Neuro/Psych  Headaches, negative psych ROS   GI/Hepatic Neg liver ROS, GERD  ,  Endo/Other  negative endocrine ROS  Renal/GU negative Renal ROS  negative genitourinary   Musculoskeletal   Abdominal   Peds  Hematology negative hematology ROS (+)   Anesthesia Other Findings   Reproductive/Obstetrics (+) Pregnancy                             Anesthesia Physical Anesthesia Plan  ASA: II  Anesthesia Plan: Epidural   Post-op Pain Management:    Induction:   Airway Management Planned:   Additional Equipment:   Intra-op Plan:   Post-operative Plan:   Informed Consent: I have reviewed the patients History and Physical, chart, labs and discussed the procedure including the risks, benefits and alternatives for the proposed anesthesia with the patient or authorized representative who has indicated his/her understanding and acceptance.     Plan Discussed with: Anesthesiologist and CRNA  Anesthesia Plan Comments:         Anesthesia Quick Evaluation

## 2016-09-18 NOTE — Discharge Summary (Signed)
Obstetrical Discharge Summary  Patient Name: Regina EvensBriana C Hissong DOB: 01-11-1991 MRN: [id number]  Date of Admission: 09/18/2016 Date of Discharge: 09/18/2016  Primary OB: [facility]  Gestational Age at Delivery: 7546w5d   Antepartum complications: GBS pos, iron deficiency anemia on Fe. Admitting Diagnosis: Active labor Secondary Diagnosis: Patient Active Problem List   Diagnosis Date Noted  . Labor and delivery, indication for care 09/18/2016  . First trimester screening 03/04/2016    Augmentation: none Complications: None Intrapartum complications/course:  Date of Delivery: 09/18/16 Delivered By: Christeen DouglasBethany Beasley Delivery Type: spontaneous vaginal delivery Anesthesia: epidural Placenta: sponatneous Laceration: bilateral labial Episiotomy: none Newborn Data: Live born female "[place] Birth Weight:  2930 gms/6#7oz APGAR: 8, 9    Discharge Physical Exam:  BP 97/71   Pulse 74   Temp 97.6 F (36.4 C) (Oral)   Resp (!) 22   Ht 5\' 7"  (1.702 m)   Wt 216 lb (98 kg)   LMP 12/08/2015 (Exact Date)   SpO2 100%   BMI 33.83 kg/m   General: NAD CV: RRR Pulm: CTABL, nl effort ABD: s/nd/nt, fundus firm and below the umbilicus Lochia: moderate DVT Evaluation: LE non-ttp, no evidence of DVT on exam.  Hemoglobin  Date Value Ref Range Status  09/18/2016 11.0 (L) 12.0 - 16.0 g/dL Final   HGB  Date Value Ref Range Status  12/06/2011 10.6 (L) 12.0 - 16.0 g/dL Final   HCT  Date Value Ref Range Status  09/18/2016 32.7 (L) 35.0 - 47.0 % Final  12/07/2011 29.5 (L) 35.0 - 47.0 % Final    Post partum course: Stable  Postpartum Procedures:none Disposition: stable, discharge to home. Baby Feeding: breastmilk Baby Disposition: home with mom  Rh Immune globulin given: n/a Rubella vaccine given: n/a Tdap vaccine given in AP or PP setting: 06/30/16 Flu vaccine given in AP or PP setting: declined  Contraception: Undecided, likely LARCs  Prenatal Labs:  ABO, Rh:  --/--/O POS (04/18 1834) Antibody:  Negative Rubella:   Immune Varicella: Immune RPR:   NR HBsAg:   Negative HIV:   NR GTT: 95 GBS:   POSITIVE HGB Solubility Negative Declined Flu vaccine Tdap: 06/30/16 1st and 2nd trimester genetic screening WNL    Plan:  Aniceto BossBriana C Defranco was discharged to home in good condition. [facility] 6 weeks.   Discharge Medications: PNV and Ibuprofen    Signed: Sharee Pimplearon W. Chanetta Moosman, MSN, CNM, FNP

## 2016-09-18 NOTE — Anesthesia Procedure Notes (Addendum)
Epidural Patient location during procedure: OB  Staffing Anesthesiologist: Priscella MannPENWARDEN, AMY Resident/CRNA: Mathews ArgyleLOGAN, Kailly Richoux Performed: resident/CRNA   Preanesthetic Checklist Completed: patient identified, site marked, surgical consent, pre-op evaluation, timeout performed, IV checked, risks and benefits discussed and monitors and equipment checked  Epidural Patient position: sitting Prep: Betadine and site prepped and draped Patient monitoring: heart rate, continuous pulse ox and blood pressure Approach: midline Location: L3-L4 Injection technique: LOR saline  Needle:  Needle type: Tuohy  Needle gauge: 17 G Needle length: 9 cm and 9 Needle insertion depth: 8 cm Catheter type: closed end flexible Catheter size: 19 Gauge Catheter at skin depth: 13 cm Test dose: negative and 1.5% lidocaine with Epi 1:200 K  Assessment Events: blood not aspirated, injection not painful, no injection resistance, negative IV test and no paresthesia  Additional Notes   Patient tolerated the insertion well without complications.Reason for block:procedure for pain

## 2016-09-18 NOTE — Progress Notes (Signed)
S:  SROM around 0620 for clear fluid, pt. Feeling more uncomfortable and has received 1 dose of IV Stadol     Pitocin turned off for tachysystole  O:  VS: Blood pressure 121/79, pulse 81, temperature 98.4 F (36.9 C), temperature source Oral, resp. rate 18, height 5\' 7"  (1.702 m), weight 98 kg (216 lb), last menstrual period 12/08/2015.        FHR : baseline 125 bpm / variability moderate / accelerations + / occasional variable decelerations        Toco: contractions every 1.5-4 minutes / moderate         Cervix : Dilation: 4 Effacement (%): 80 Cervical Position: Posterior Station: -2 Presentation: Vertex Exam by:: LSE        Membranes: SROM - clear fluid  A: Latent labor     FHR category 2      GBS Positive  P: Has received 2 doses of Ampicillin      Stadol PRN or epidural when ready      Anticipate NSVD  Regina Fischer, CNM

## 2016-09-18 NOTE — H&P (Signed)
  OB ADMISSION/ HISTORY & PHYSICAL:  Admission Date: 09/18/2016 12:40 AM  Admit Diagnosis: Indication for Labor and delivery at 40+5 weeks  Regina Fischer is a 25 y.o. female G2P1 at 40+5 weeks presenting for labor and possible ROM.  She was seen in the office today and membranes were stripped per patient request.  She reports painful ctxs started around 0030.    Prenatal History: G2P1001   EDC : 09/13/16 by LMp.  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 Medical history non-contributory      Past surgical history:  Past Surgical History:  Procedure Laterality Date  . SMALL INTESTINE SURGERY  1994   at birth-pt unsure of any details    Family History:  Family History  Problem Relation Age of Onset  . Diabetes Mother   . Diabetes Maternal Grandmother   . Kidney disease Maternal Grandmother      Social History:  reports that she has never smoked. She has never used smokeless tobacco. She reports that she does not drink alcohol or use drugs.   Allergies: Patient has no known allergies.    Current Medications at time of admission:  Prior to Admission medications   Medication Sig Start Date End Date Taking? Authorizing Provider  Prenatal Vit-Fe Fumarate-FA (PRENATAL MULTIVITAMIN) TABS tablet Take 1 tablet by mouth daily at 12 noon.   Yes Historical Provider, MD     Review of Systems: Active FM onset of ctx @ 0030  Possible LOF  / SROM bloody show  present   Physical Exam:  VS: Blood pressure 118/74, pulse 79, temperature (P) 98.1 F (36.7 C), temperature source (P) Oral, resp. rate (P) 18, height (P) 5\' 7"  (1.702 m), weight (P) 98 kg (216 lb), last menstrual period 12/08/2015.  General: alert and oriented, appears calm Heart: RRR Lungs: Clear lung fields Abdomen: Gravid, soft and non-tender, non-distended / uterus: gravid, non-tender, mild ctxs Extremities: mild LE edema  Genitalia / VE: Dilation: 3.5 Effacement (%): 60 Station: -2 Exam by:: [place] Nolton, CNM   Negative Fern Indeterminate Nitrazine  Amniotic sac palpated on SVE  FHR: baseline rate 135 bpm / variability moderate / accelerations + / no decelerations TOCO: irregular ctxs  Assessment: 40+[redacted] weeks gestation Latent stage of labor FHR category 1 GBS Positive   Plan:  1. Admit to Birth Place     - Routine labor and delivery orders     - Stadol PRN for mild pain    - May have epidural in active labor 2. GBS Positive     - Ampicillin 2 grams loading dose, then 1 gram every 4 hours     - AROM after 4 hours of abx 3. Postpartum:    - Breast    - Unknown, possible LARC 4. Anticipate NSVD    - Proven pelvis: 7#2oz  Dr. Dalbert GarnetBeasley notified of admission / plan of care  Carlean JewsMeredith Tayven Renteria, CNM

## 2016-09-19 LAB — RPR: RPR Ser Ql: NONREACTIVE

## 2016-09-19 LAB — CBC
HCT: 32.2 % — ABNORMAL LOW (ref 35.0–47.0)
Hemoglobin: 10.6 g/dL — ABNORMAL LOW (ref 12.0–16.0)
MCH: 26 pg (ref 26.0–34.0)
MCHC: 32.8 g/dL (ref 32.0–36.0)
MCV: 79.3 fL — ABNORMAL LOW (ref 80.0–100.0)
PLATELETS: 185 10*3/uL (ref 150–440)
RBC: 4.06 MIL/uL (ref 3.80–5.20)
RDW: 13.5 % (ref 11.5–14.5)
WBC: 11.1 10*3/uL — AB (ref 3.6–11.0)

## 2016-09-19 MED ORDER — IBUPROFEN 600 MG PO TABS
600.0000 mg | ORAL_TABLET | Freq: Four times a day (QID) | ORAL | Status: DC
Start: 2016-09-19 — End: 2016-09-20
  Administered 2016-09-19 (×2): 600 mg via ORAL
  Filled 2016-09-19 (×3): qty 1

## 2016-09-19 NOTE — Anesthesia Postprocedure Evaluation (Signed)
Anesthesia Post Note  Patient: Regina Fischer  Procedure(s) Performed: * No procedures listed *  Patient location during evaluation: Mother Baby Anesthesia Type: Epidural Level of consciousness: awake, awake and alert and oriented Pain management: pain level controlled Vital Signs Assessment: post-procedure vital signs reviewed and stable Respiratory status: spontaneous breathing Cardiovascular status: blood pressure returned to baseline Postop Assessment: no headache, no backache, no signs of nausea or vomiting and adequate PO intake Anesthetic complications: no    Last Vitals:  Vitals:   09/18/16 2319 09/19/16 0326  BP: (!) 109/48 110/66  Pulse: 94 76  Resp: 16 14  Temp: 37 C 37.1 C    Last Pain:  Vitals:   09/19/16 0400  TempSrc:   PainSc: 2                  Mickey Hebel Lawerance CruelStarr

## 2016-09-19 NOTE — Progress Notes (Signed)
Post Partum Day 1 Subjective: Doing well, no complaints.  Tolerating regular diet, pain with PO meds, voiding and ambulating without difficulty.  No CP SOB F/C N/V or leg pain   Objective: BP 105/66 (BP Location: Left Arm)   Pulse 64   Temp 98.1 F (36.7 C) (Oral)   Resp 16   Ht 5\' 7"  (1.702 m)   Wt 98 kg (216 lb)   LMP 12/08/2015 (Exact Date)   SpO2 100%   Breastfeeding? Unknown   BMI 33.83 kg/m    Physical Exam:  General: NAD CV: RRR Pulm: nl effort, CTABL Lochia: moderate Uterine Fundus: fundus firm and below umbilicus DVT Evaluation: no cords, ttp LEs    Recent Labs  09/18/16 0325 09/19/16 0540  HGB 11.0* 10.6*  HCT 32.7* 32.2*  WBC 8.0 11.1*  PLT 191 185    Assessment/Plan: 25 y.o. G2P2002 postpartum day # 1  1. Baby has positive Coombs test, and will need serial blood bili levels.  Discharge pending that trend and necessary interventions.  Patient to remain inpatient until course is determined. 2. Routine post partum cares. 3. Breast feeding    ----- Ranae Plumberhelsea Ward, MD Attending Obstetrician and Gynecologist [facility]

## 2016-09-20 MED ORDER — FERROUS SULFATE 300 (60 FE) MG/5ML PO SYRP: 300.0000 mg | ORAL_SOLUTION | Freq: Every day | ORAL | 3 refills | Status: DC

## 2016-09-20 NOTE — Discharge Instructions (Signed)
Please call your doctor or return to the ER if you experience any chest pains, shortness of breath, fever greater than 101, any heavy bleeding (saturating more than 1 pad per hour), large clots, or foul smelling discharge, any worsening abdominal pain and cramping that is not controlled by pain medication, or any signs of postpartum depression. No tampons, enemas, douches, or sexual intercourse for 6 weeks. Also avoid tub baths, hot tubs, or swimming for 6 weeks.  ° ° ° °Care After Vaginal Delivery °Congratulations on your new baby!! ° °Refer to this sheet in the next few weeks. These discharge instructions provide you with information on caring for yourself after delivery. Your caregiver may also give you specific instructions. Your treatment has been planned according to the most current medical practices available, but problems sometimes occur. Call your caregiver if you have any problems or questions after you go home. ° °HOME CARE INSTRUCTIONS °· Take over-the-counter or prescription medicines only as directed by your caregiver or pharmacist. °· Do not drink alcohol, especially if you are breastfeeding or taking medicine to relieve pain. °· Do not chew or smoke tobacco. °· Do not use illegal drugs. °· Continue to use good perineal care. Good perineal care includes: °¨ Wiping your perineum from front to back. °¨ Keeping your perineum clean. °· Do not use tampons or douche until your caregiver says it is okay. °· Shower, wash your hair, and take tub baths as directed by your caregiver. °· Wear a well-fitting bra that provides breast support. °· Eat healthy foods. °· Drink enough fluids to keep your urine clear or pale yellow. °· Eat high-fiber foods such as whole grain cereals and breads, brown rice, beans, and fresh fruits and vegetables every day. These foods may help prevent or relieve constipation. °· Follow your caregiver's recommendations regarding resumption of activities such as climbing stairs, driving,  lifting, exercising, or traveling. Specifically, no driving for two weeks, so that you are comfortable reacting quickly in an emergency. °· Talk to your caregiver about resuming sexual activities. Resumption of sexual activities is dependent upon your risk of infection, your rate of healing, and your comfort and desire to resume sexual activity. Usually we recommend waiting about six weeks, or until your bleeding stops and you are interested in sex. °· Try to have someone help you with your household activities and your newborn for at least a few days after you leave the hospital. Even longer is better. °· Rest as much as possible. Try to rest or take a nap when your newborn is sleeping. Sleep deprivation can be very hard after delivery. °· Increase your activities gradually. °· Keep all of your scheduled postpartum appointments. It is very important to keep your scheduled follow-up appointments. At these appointments, your caregiver will be checking to make sure that you are healing physically and emotionally. ° °SEEK MEDICAL CARE IF:  °· You are passing large clots from your vagina.  °· You have a foul smelling discharge from your vagina. °· You have trouble urinating. °· You are urinating frequently. °· You have pain when you urinate. °· You have a change in your bowel movements. °· You have increasing redness, pain, or swelling near your vaginal incision (episiotomy) or vaginal tear. °· You have pus draining from your episiotomy or vaginal tear. °· Your episiotomy or vaginal tear is separating. °· You have painful, hard, or reddened breasts. °· You have a severe headache. °· You have blurred vision or see spots. °· You feel   sad or depressed. °· You have thoughts of hurting yourself or your newborn. °· You have questions about your care, the care of your newborn, or medicines. °· You are dizzy or light-headed. °· You have a rash. °· You have nausea or vomiting. °· You were breastfeeding and have not had a  menstrual period within 12 weeks after you stopped breastfeeding. °· You are not breastfeeding and have not had a menstrual period by the 12th week after delivery. °· You have a fever. ° °SEEK IMMEDIATE MEDICAL CARE IF:  °· You have persistent pain. °· You have chest pain. °· You have shortness of breath. °· You faint. °· You have leg pain. °· You have stomach pain. °· Your vaginal bleeding saturates two or more sanitary pads in 1 hour. ° °MAKE SURE YOU:  °· Understand these instructions. °· Will get help right away if you are not doing well or get worse. °·  °Document Released: 09/27/2000 Document Revised: 02/14/2014 Document Reviewed: 05/27/2012 ° °ExitCare® Patient Information ©2015 [employer]. This information is not intended to replace advice given to you by your health care provider. Make sure you discuss any questions you have with your health care provider. ° °

## 2016-09-20 NOTE — Progress Notes (Signed)
Discharge order received from doctor. Reviewed discharge instructions and prescriptions with patient and answered all questions. Follow up appointment instructions given. Patient verbalized understanding. ID bands checked. Patient discharged home with infant via wheelchair by nursing/auxillary.    Uel Davidow Garner, RN  

## 2016-09-20 NOTE — Progress Notes (Signed)
Post Partum Day 2 Subjective: Feels well and wants to go home today  Objective: Blood pressure 127/72, pulse 75, temperature 98 F (36.7 C), temperature source Oral, resp. rate 19, height 5\' 7"  (1.702 m), weight 98 kg (216 lb), last menstrual period 12/08/2015, SpO2 100 %, unknown if currently breastfeeding.  Physical Exam:  General: 25 yo black female in NAD.A,A&O x 3.  Lungs: CTA bilat, no W/R/R. Heart: S1S2, RRR, No M/R/G. Lochia:Mod, no clots Uterine Fundus:U-2, FF  Incision: Perineum intact DVT Evaluation: Neg Homans   Recent Labs  09/18/16 0325 09/19/16 0540  HGB 11.0* 10.6*  HCT 32.7* 32.2*    Assessment/Plan: A; PPD#2 stable P: DC home 2. Pt not sure of contraception yet.   LOS: 2 days   Sharee PimpleCaron W Ronnika Collett 09/20/2016, 7:31 AM

## 2017-10-16 IMAGING — US US OB TRANSVAGINAL
1 series · 14 of 28 positions shown · non-contrast
Comparison: None.

CLINICAL DATA: First trimester of pregnancy, lower abdominal pain.

EXAM:
OBSTETRIC <14 WK US AND TRANSVAGINAL OB US
TECHNIQUE: Both transabdominal and transvaginal ultrasound examinations were
performed for complete evaluation of the gestation as well as the
maternal uterus, adnexal regions, and pelvic cul-de-sac.
Transvaginal technique was performed to assess early pregnancy.

[Series 1: us ob transvaginal · 0.20mm/px · 14 of 174 slices shown]
[im 7/174]
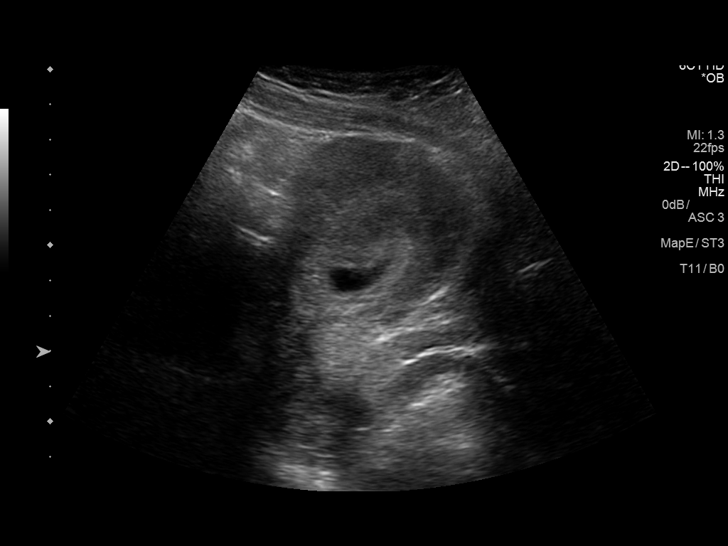
[im 20/174]
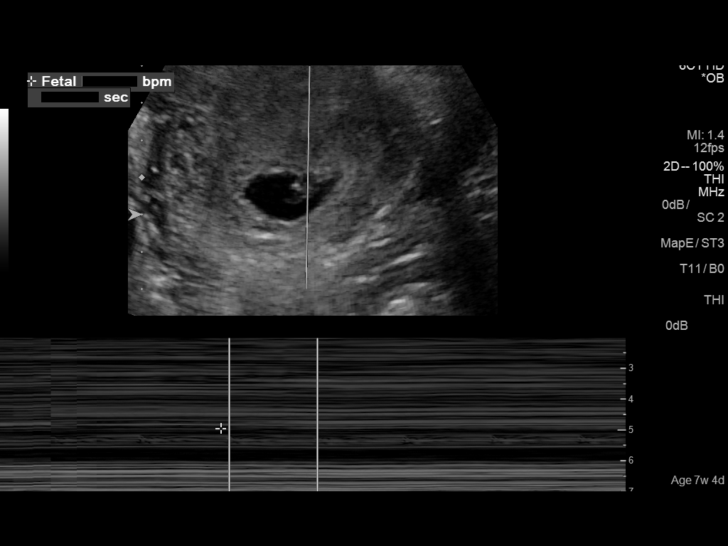
[im 33/174]
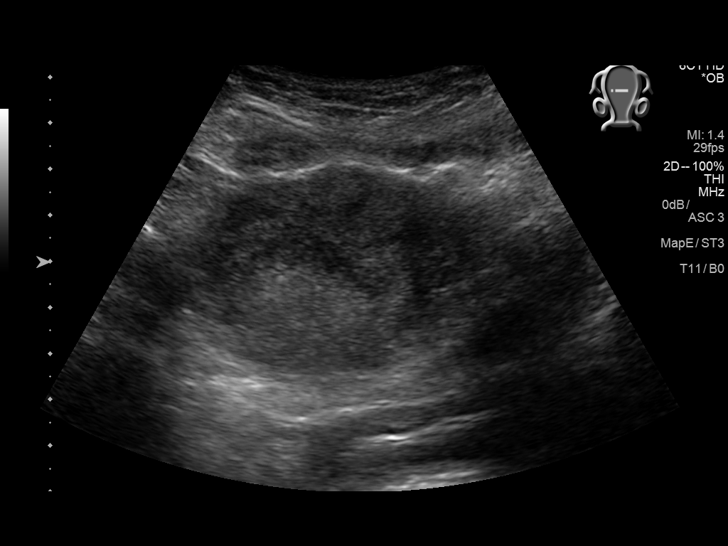
[im 45/174]
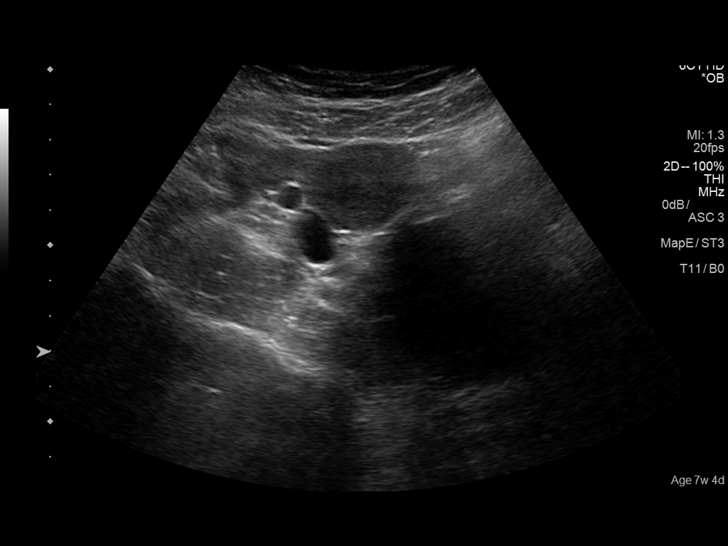
[im 58/174]
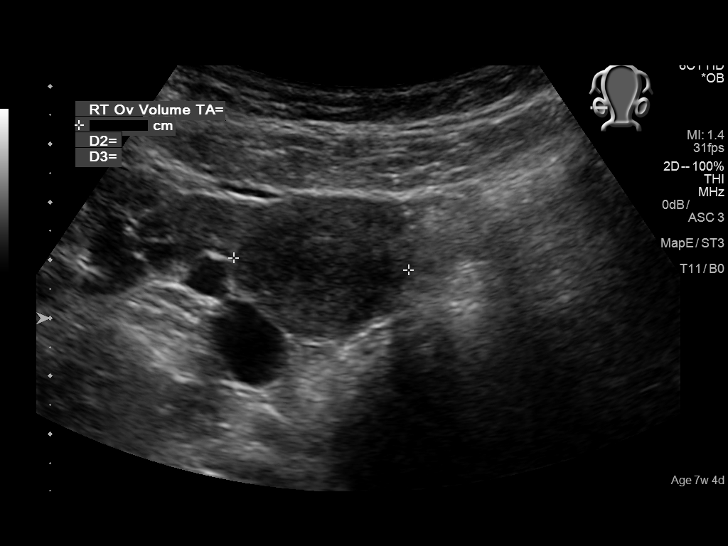
[im 71/174]
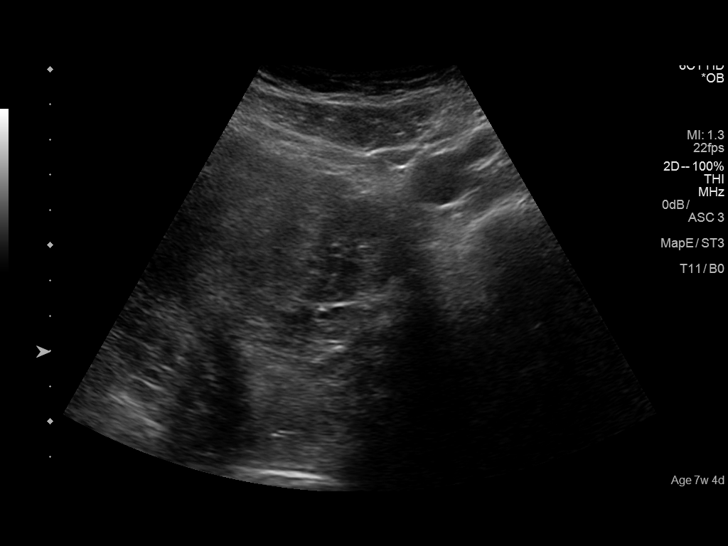
[im 84/174]
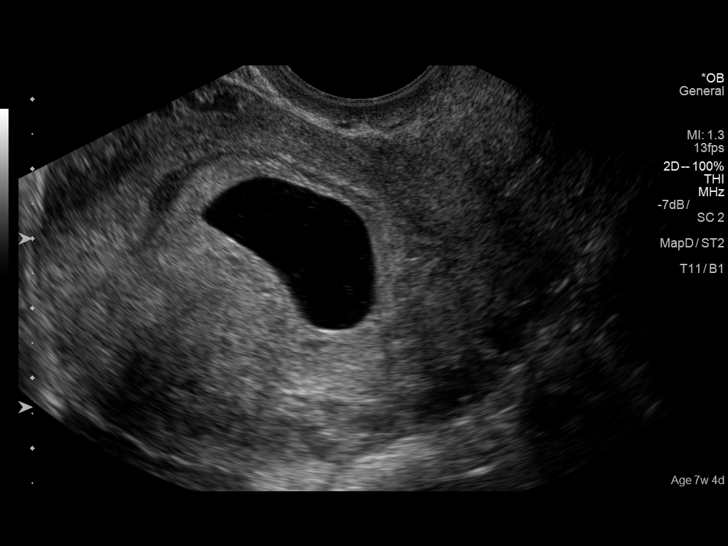
[im 97/174]
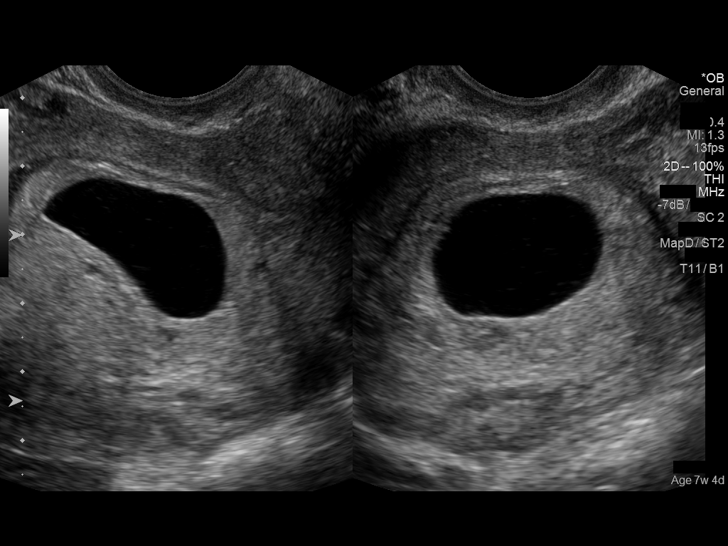
[im 109/174]
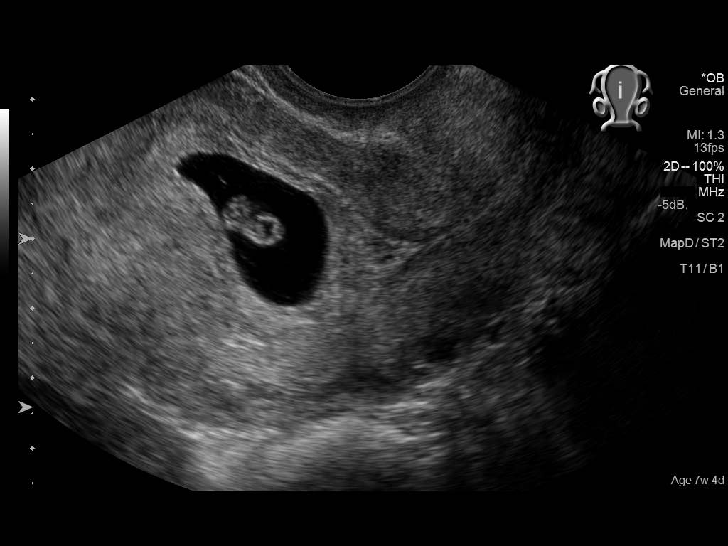
[im 122/174]
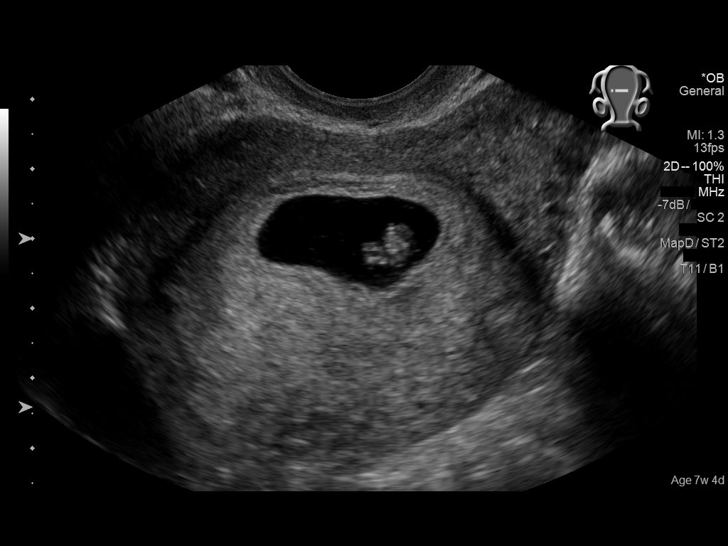
[im 135/174]
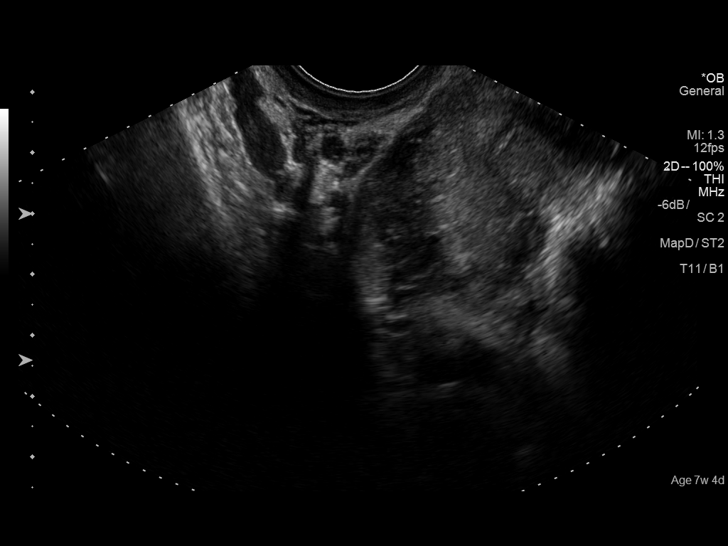
[im 148/174]
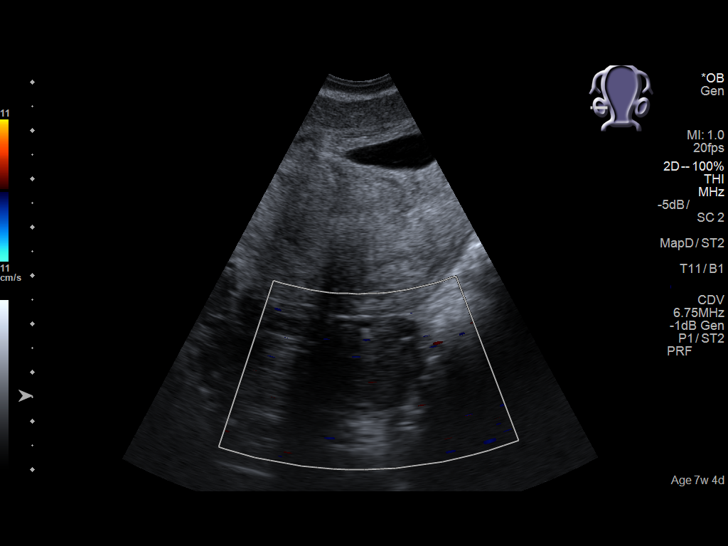
[im 161/174]
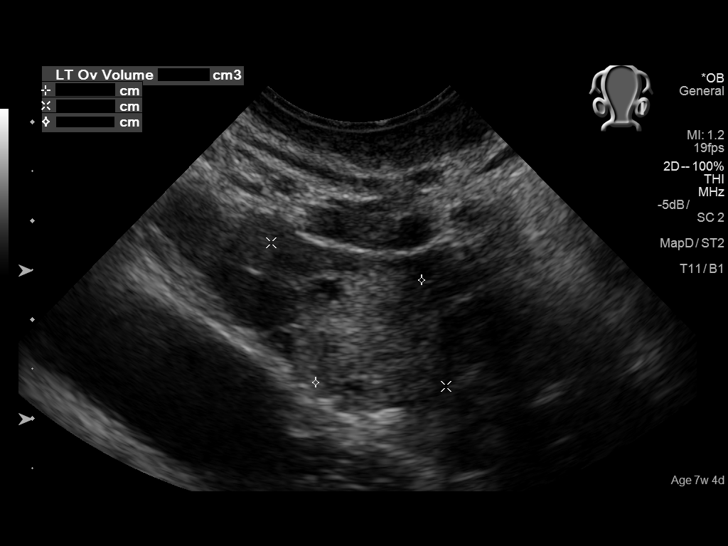
[im 174/174]
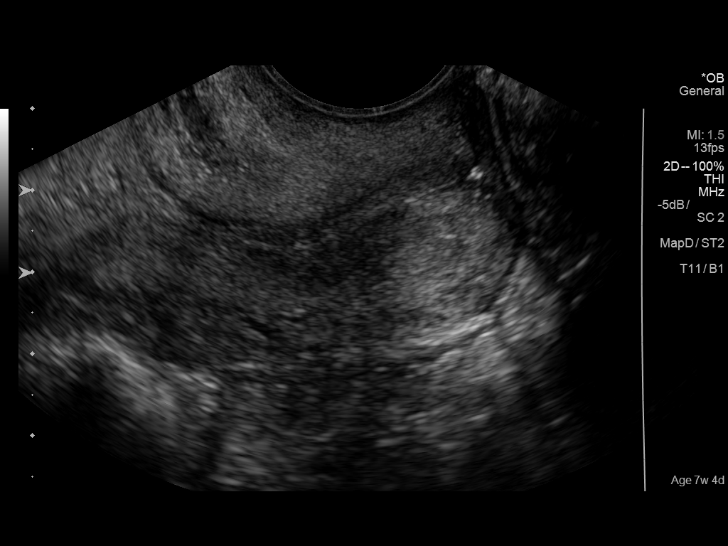

[14 of 28 positions shown; findings below may reference images not displayed]

FINDINGS: Intrauterine gestational sac: Single.

Yolk sac:  Visualized.

Embryo:  Visualized.

Cardiac Activity: Visualized.

Heart Rate: 115  bpm

CRL:  9.4  mm   7 w   0 d                  US EDC: September 17, 2016.

Subchorionic hemorrhage:  None visualized.

Maternal uterus/adnexae: Ovaries appear normal. No free fluid is
noted.
IMPRESSION: Single live intrauterine gestation of 7 weeks 0 days.

## 2019-08-18 ENCOUNTER — Other Ambulatory Visit: Payer: Self-pay

## 2019-08-18 DIAGNOSIS — Z20822 Contact with and (suspected) exposure to covid-19: Secondary | ICD-10-CM

## 2019-08-19 LAB — NOVEL CORONAVIRUS, NAA: SARS-CoV-2, NAA: NOT DETECTED

## 2019-10-12 ENCOUNTER — Ambulatory Visit: Payer: HRSA Program | Attending: Internal Medicine

## 2019-10-12 DIAGNOSIS — Z20828 Contact with and (suspected) exposure to other viral communicable diseases: Secondary | ICD-10-CM | POA: Insufficient documentation

## 2019-10-12 DIAGNOSIS — Z20822 Contact with and (suspected) exposure to covid-19: Secondary | ICD-10-CM

## 2019-10-13 LAB — NOVEL CORONAVIRUS, NAA: SARS-CoV-2, NAA: NOT DETECTED

## 2021-01-02 ENCOUNTER — Other Ambulatory Visit: Payer: Self-pay

## 2021-01-02 ENCOUNTER — Emergency Department: Payer: Self-pay

## 2021-01-02 ENCOUNTER — Emergency Department
Admission: EM | Admit: 2021-01-02 | Discharge: 2021-01-02 | Disposition: A | Discharge status: Home / Self Care | Payer: Self-pay | Attending: Emergency Medicine | Admitting: Emergency Medicine

## 2021-01-02 DIAGNOSIS — N939 Abnormal uterine and vaginal bleeding, unspecified: Secondary | ICD-10-CM | POA: Insufficient documentation

## 2021-01-02 DIAGNOSIS — R102 Pelvic and perineal pain: Secondary | ICD-10-CM | POA: Insufficient documentation

## 2021-01-02 LAB — CBC
HCT: 40.8 % (ref 36.0–46.0)
Hemoglobin: 13.6 g/dL (ref 12.0–15.0)
MCH: 29.1 pg (ref 26.0–34.0)
MCHC: 33.3 g/dL (ref 30.0–36.0)
MCV: 87.4 fL (ref 80.0–100.0)
Platelets: 255 10*3/uL (ref 150–400)
RBC: 4.67 MIL/uL (ref 3.87–5.11)
RDW: 12.3 % (ref 11.5–15.5)
WBC: 5.5 10*3/uL (ref 4.0–10.5)
nRBC: 0.4 % — ABNORMAL HIGH (ref 0.0–0.2)

## 2021-01-02 LAB — BASIC METABOLIC PANEL
Anion gap: 8 (ref 5–15)
BUN: 7 mg/dL (ref 6–20)
CO2: 24 mmol/L (ref 22–32)
Calcium: 9.2 mg/dL (ref 8.9–10.3)
Chloride: 106 mmol/L (ref 98–111)
Creatinine, Ser: 0.65 mg/dL (ref 0.44–1.00)
GFR, Estimated: >60 mL/min (ref >60)
Glucose, Bld: 96 mg/dL (ref 70–99)
Potassium: 3.8 mmol/L (ref 3.5–5.1)
Sodium: 138 mmol/L (ref 135–145)

## 2021-01-02 LAB — URINALYSIS, COMPLETE (UACMP) WITH MICROSCOPIC
Bilirubin Urine: NEGATIVE
Glucose, UA: NEGATIVE mg/dL
Ketones, ur: 5 mg/dL — AB
Nitrite: NEGATIVE
Protein, ur: 30 mg/dL — AB
RBC / HPF: >50 RBC/hpf — ABNORMAL HIGH (ref 0–5)
Specific Gravity, Urine: 1.031 — ABNORMAL HIGH (ref 1.005–1.030)
pH: 6 (ref 5.0–8.0)

## 2021-01-02 LAB — POC URINE PREG, ED: Preg Test, Ur: NEGATIVE

## 2021-01-02 NOTE — ED Provider Notes (Signed)
[facility] Emergency Department Provider Note  ____________________________________________   Event Date/Time   First MD Initiated Contact with Patient 01/02/21 2303     (approximate)  I have reviewed the triage vital signs and the nursing notes.   HISTORY  Chief Complaint Vaginal Bleeding    HPI Regina Fischer is a 30 y.o. female who presents to the emergency department with complaints of sharp, crampy diffuse pelvic pain and vaginal bleeding that started yesterday.  Patient had a an elective abortion on 2/9 that she took medication for.  She states today that she passed a large clot that she was concerned was retained products of conception.  No vaginal discharge.  No dysuria, hematuria, urinary frequency or urgency.  No fevers, vomiting or diarrhea.  She denies any concern for STD.     Past Medical History:  Diagnosis Date  . Medical history non-contributory     Patient Active Problem List   Diagnosis Date Noted  . Labor and delivery, indication for care 09/18/2016  . First trimester screening 03/04/2016    Past Surgical History:  Procedure Laterality Date  . SMALL INTESTINE SURGERY  1994   at birth-pt unsure of any details    Prior to Admission medications   Medication Sig Start Date End Date Taking? Authorizing Provider  ferrous sulfate 300 (60 Fe) MG/5ML syrup Take 5 mLs (300 mg total) by mouth daily. 09/20/16   Sharee Pimple, CNM  Prenatal Vit-Fe Fumarate-FA (PRENATAL MULTIVITAMIN) TABS tablet Take 1 tablet by mouth daily at 12 noon.    [provider]    Allergies Patient has no known allergies.  Family History  Problem Relation Age of Onset  . Diabetes Mother   . Diabetes Maternal Grandmother   . Kidney disease Maternal Grandmother     Social History Social History   Tobacco Use  . Smoking status: Never Smoker  . Smokeless tobacco: Never Used  Substance Use Topics  . Alcohol use: Yes    Alcohol/week: 0.0  standard drinks    Comment: ocass  . Drug use: No    Review of Systems Constitutional: No fever. Eyes: No visual changes. ENT: No sore throat. Cardiovascular: Denies chest pain. Respiratory: Denies shortness of breath. Gastrointestinal: No nausea, vomiting, diarrhea. Genitourinary: Negative for dysuria. Musculoskeletal: Negative for back pain. Skin: Negative for rash. Neurological: Negative for focal weakness or numbness.  ____________________________________________   PHYSICAL EXAM:  VITAL SIGNS: ED Triage Vitals  Enc Vitals Group     BP 01/02/21 1750 (!) 123/97     Pulse Rate 01/02/21 1750 91     Resp 01/02/21 1750 18     Temp 01/02/21 1750 98.3 F (36.8 C)     Temp Source 01/02/21 1750 Oral     SpO2 01/02/21 1750 99 %     Weight 01/02/21 1751 240 lb (108.9 kg)     Height 01/02/21 1751 5\' 7"  (1.702 m)     Head Circumference --      Peak Flow --      Pain Score 01/02/21 1751 5     Pain Loc --      Pain Edu? --      Excl. in GC? --    CONSTITUTIONAL: Alert and oriented and responds appropriately to questions. Well-appearing; well-nourished HEAD: Normocephalic EYES: Conjunctivae clear, pupils appear equal, EOM appear intact ENT: normal nose; moist mucous membranes NECK: Supple, normal ROM CARD: RRR; S1 and S2 appreciated; no murmurs, no clicks, no rubs, no  gallops RESP: Normal chest excursion without splinting or tachypnea; breath sounds clear and equal bilaterally; no wheezes, no rhonchi, no rales, no hypoxia or respiratory distress, speaking full sentences ABD/GI: Normal bowel sounds; non-distended; soft, non-tender, no rebound, no guarding, no peritoneal signs, no hepatosplenomegaly GU: Deferred per patient request BACK: The back appears normal EXT: Normal ROM in all joints; no deformity noted, no edema; no cyanosis SKIN: Normal color for age and race; warm; no rash on exposed skin NEURO: Moves all extremities equally PSYCH: The patient's mood and manner are  appropriate.  ____________________________________________   LABS (all labs ordered are listed, but only abnormal results are displayed)  Labs Reviewed  CBC - Abnormal; Notable for the following components:      Result Value   nRBC 0.4 (*)    All other components within normal limits  URINALYSIS, COMPLETE (UACMP) WITH MICROSCOPIC - Abnormal; Notable for the following components:   Color, Urine YELLOW (*)    APPearance HAZY (*)    Specific Gravity, Urine 1.031 (*)    Hgb urine dipstick LARGE (*)    Ketones, ur 5 (*)    Protein, ur 30 (*)    Leukocytes,Ua TRACE (*)    RBC / HPF >50 (*)    Bacteria, UA RARE (*)    All other components within normal limits  BASIC METABOLIC PANEL  POC URINE PREG, ED   ____________________________________________  EKG  none ____________________________________________  RADIOLOGY I, Rudie Rikard, personally viewed and evaluated these images (plain radiographs) as part of my medical decision making, as well as reviewing the written report by the radiologist.  ED MD interpretation: No retained products of conception.  Official radiology report(s): US PELVIC COMPLETE WITH TRANSVAGINAL  Result Date: 01/02/2021 CLINICAL DATA:  Irregular bleeding after elective abortion. EXAM: TRANSABDOMINAL AND TRANSVAGINAL ULTRASOUND OF PELVIS TECHNIQUE: Both transabdominal and transvaginal ultrasound examinations of the pelvis were performed. Transabdominal technique was performed for global imaging of the pelvis including uterus, ovaries, adnexal regions, and pelvic cul-de-sac. It was necessary to proceed with endovaginal exam following the transabdominal exam to visualize the endometrium and ovaries. COMPARISON:  None FINDINGS: Uterus Measurements: 8.9 x 5.1 x 5.0 cm = volume: 119 mL. No fibroids or other mass visualized. Endometrium Thickness: 12 mm which is within normal limits for patient of reproductive age. No focal abnormality visualized. Right ovary  Measurements: 2.7 x 2.0 x 1.9 cm = volume: 5 mL. Normal appearance/no adnexal mass. Left ovary Measurements: 2.8 x 1.5 x 1.4 cm = volume: 3 mL. Normal appearance/no adnexal mass. Other findings No abnormal free fluid. IMPRESSION: No definite abnormality seen in the pelvis. Electronically Signed   By: Lupita Raider M.D.   On: 01/02/2021 21:14    ____________________________________________   PROCEDURES  Procedure(s) performed (including Critical Care):  Procedures  ____________________________________________   INITIAL IMPRESSION / ASSESSMENT AND PLAN / ED COURSE  As part of my medical decision making, I reviewed the following data within the electronic MEDICAL RECORD NUMBER Nursing notes reviewed and incorporated, Labs reviewed , Patient signed out to  and Notes from prior ED visits         Patient here with vaginal bleeding 1 month after recent abortion.  Labs here are reassuring.  No leukocytosis.  Normal hemoglobin of 13.6.  Urine does show blood but suspect this is from her vaginal bleeding.  She is not having urinary symptoms.  Her pregnancy test today is negative and her transvaginal ultrasound shows no retained products of conception with  normal blood flow to both ovaries.  Her abdominal exam today is benign.  Doubt appendicitis, TOA, intermittent torsion.  She declines pelvic exam today.  She does not describe hemorrhaging.  She is hemodynamically stable.  States she will follow-up with her primary care doctor if she desires STD screening.  I suspect that this is her menstrual cycle.  Have recommended alternating Tylenol and Motrin for pain.  She declines anything for pain here in the emergency department.  Discussed return precautions.  Will discharge home.  At this time, I do not feel there is any life-threatening condition present. I have reviewed, interpreted and discussed all results (EKG, imaging, lab, urine as appropriate) and exam findings with patient/family. I have reviewed  nursing notes and appropriate previous records.  I feel the patient is safe to be discharged home without further emergent workup and can continue workup as an outpatient as needed. Discussed usual and customary return precautions. Patient/family verbalize understanding and are comfortable with this plan.  Outpatient follow-up has been provided as needed. All questions have been answered.  ____________________________________________   FINAL CLINICAL IMPRESSION(S) / ED DIAGNOSES  Final diagnoses:  Pelvic pain  Vaginal bleeding     ED Discharge Orders    None      *Please note:  Sylvester Salonga Citron was evaluated in Emergency Department on 01/02/2021 for the symptoms described in the history of present illness. She was evaluated in the context of the global COVID-19 pandemic, which necessitated consideration that the patient might be at risk for infection with the SARS-CoV-2 virus that causes COVID-19. Institutional protocols and algorithms that pertain to the evaluation of patients at risk for COVID-19 are in a state of rapid change based on information released by regulatory bodies including the CDC and federal and state organizations. These policies and algorithms were followed during the patient's care in the ED.  Some ED evaluations and interventions may be delayed as a result of limited staffing during and the pandemic.*   Note:  This document was prepared using Dragon voice recognition software and may include unintentional dictation errors.   Erasmo Vertz, Layla Maw, DO 01/02/21 2321

## 2021-01-02 NOTE — ED Triage Notes (Signed)
Pt arrives to ER c/o vaginal bleeding and pelvic pain that began yesterday. States yesterday was brown and today is red. Abortion a month ago. States she thought she saw some tissue/products of conception in bleeding today.

## 2021-01-02 NOTE — Discharge Instructions (Signed)
You may alternate Tylenol 1000 mg every 6 hours as needed for pain, fever and Ibuprofen 800 mg every 8 hours as needed for pain, fever.  Please take Ibuprofen with food.  Do not take more than 4000 mg of Tylenol (acetaminophen) in a 24 hour period.  Your labs, ultrasound today were normal.  Her pregnancy test is negative.  There are no retained products of conception present today.  I suspect that this is your first period after your recent abortion.  Steps to find a Primary Care Provider (PCP):  Call [phone] or [phone] to access "[place] Find a Doctor Service."  2.  You may also go on the [facility] website at [email or web address]

## 2021-04-29 ENCOUNTER — Other Ambulatory Visit: Payer: Self-pay

## 2021-04-29 ENCOUNTER — Emergency Department
Admission: EM | Admit: 2021-04-29 | Discharge: 2021-04-29 | Disposition: A | Discharge status: Home / Self Care | Payer: Self-pay | Attending: Emergency Medicine | Admitting: Emergency Medicine

## 2021-04-29 ENCOUNTER — Emergency Department: Payer: Self-pay

## 2021-04-29 ENCOUNTER — Encounter: Payer: Self-pay | Admitting: Emergency Medicine

## 2021-04-29 DIAGNOSIS — R102 Pelvic and perineal pain: Secondary | ICD-10-CM | POA: Insufficient documentation

## 2021-04-29 DIAGNOSIS — O208 Other hemorrhage in early pregnancy: Secondary | ICD-10-CM | POA: Insufficient documentation

## 2021-04-29 DIAGNOSIS — Z3A08 8 weeks gestation of pregnancy: Secondary | ICD-10-CM | POA: Insufficient documentation

## 2021-04-29 DIAGNOSIS — O26891 Other specified pregnancy related conditions, first trimester: Secondary | ICD-10-CM | POA: Insufficient documentation

## 2021-04-29 DIAGNOSIS — O209 Hemorrhage in early pregnancy, unspecified: Secondary | ICD-10-CM

## 2021-04-29 LAB — CBC
HCT: 42.4 % (ref 36.0–46.0)
Hemoglobin: 14.3 g/dL (ref 12.0–15.0)
MCH: 29.7 pg (ref 26.0–34.0)
MCHC: 33.7 g/dL (ref 30.0–36.0)
MCV: 88 fL (ref 80.0–100.0)
Platelets: 208 10*3/uL (ref 150–400)
RBC: 4.82 MIL/uL (ref 3.87–5.11)
RDW: 12.3 % (ref 11.5–15.5)
WBC: 3.9 10*3/uL — ABNORMAL LOW (ref 4.0–10.5)
nRBC: 0 % (ref 0.0–0.2)

## 2021-04-29 LAB — URINALYSIS, COMPLETE (UACMP) WITH MICROSCOPIC
Bilirubin Urine: NEGATIVE
Glucose, UA: NEGATIVE mg/dL
Ketones, ur: 5 mg/dL — AB
Nitrite: NEGATIVE
Protein, ur: 30 mg/dL — AB
Specific Gravity, Urine: 1.027 (ref 1.005–1.030)
pH: 5 (ref 5.0–8.0)

## 2021-04-29 LAB — COMPREHENSIVE METABOLIC PANEL
ALT: 15 U/L (ref 0–44)
AST: 16 U/L (ref 15–41)
Albumin: 4.3 g/dL (ref 3.5–5.0)
Alkaline Phosphatase: 65 U/L (ref 38–126)
Anion gap: 7 (ref 5–15)
BUN: 8 mg/dL (ref 6–20)
CO2: 23 mmol/L (ref 22–32)
Calcium: 9.6 mg/dL (ref 8.9–10.3)
Chloride: 106 mmol/L (ref 98–111)
Creatinine, Ser: 0.78 mg/dL (ref 0.44–1.00)
GFR, Estimated: >60 mL/min (ref >60)
Glucose, Bld: 115 mg/dL — ABNORMAL HIGH (ref 70–99)
Potassium: 4.7 mmol/L (ref 3.5–5.1)
Sodium: 136 mmol/L (ref 135–145)
Total Bilirubin: 1 mg/dL (ref 0.3–1.2)
Total Protein: 7.7 g/dL (ref 6.5–8.1)

## 2021-04-29 LAB — HCG, QUANTITATIVE, PREGNANCY: hCG, Beta Chain, Quant, S: 43507 m[IU]/mL — ABNORMAL HIGH (ref <5)

## 2021-04-29 MED ORDER — CEPHALEXIN 500 MG PO CAPS: 500.0000 mg | ORAL_CAPSULE | Freq: Four times a day (QID) | ORAL | 0 refills | Status: DC

## 2021-04-29 NOTE — ED Provider Notes (Signed)
[facility]-EMERGENCY DEPARTMENT  ____________________________________________  Time seen: Approximately 3:34 PM  I have reviewed the triage vital signs and the nursing notes.   HISTORY  Chief Complaint Vaginal Bleeding and Abdominal Pain   Historian Patient    HPI Regina Fischer is a 30 y.o. female G4, P3 presents to the emergency department with vaginal bleeding for the past 2 days and pelvic cramping.  Patient describes blood as dark brown in appearance.  She states the cramping is intermittent.  Patient denies abdominal pain, nausea, vomiting or diarrhea.  She denies dysuria, hematuria, increased urinary frequency or low back pain.  Patient denies falls or mechanisms of trauma.  Patient has not establish care with an OB/GYN as of yet.  She is O+.   Past Medical History:  Diagnosis Date   Medical history non-contributory      Immunizations up to date:  Yes.     Past Medical History:  Diagnosis Date   Medical history non-contributory     Patient Active Problem List   Diagnosis Date Noted   Labor and delivery, indication for care 09/18/2016   First trimester screening 03/04/2016    Past Surgical History:  Procedure Laterality Date   SMALL INTESTINE SURGERY  1994   at birth-pt unsure of any details    Prior to Admission medications   Medication Sig Start Date End Date Taking? Authorizing Provider  cephALEXin (KEFLEX) 500 MG capsule Take 1 capsule (500 mg total) by mouth 4 (four) times daily for 7 days. 04/29/21 05/06/21 Yes Pia Mau M, PA-C  ferrous sulfate 300 (60 Fe) MG/5ML syrup Take 5 mLs (300 mg total) by mouth daily. 09/20/16   Sharee Pimple, CNM  Prenatal Vit-Fe Fumarate-FA (PRENATAL MULTIVITAMIN) TABS tablet Take 1 tablet by mouth daily at 12 noon.    [provider]    Allergies Patient has no known allergies.  Family History  Problem Relation Age of Onset   Diabetes Mother    Diabetes Maternal Grandmother    Kidney disease Maternal  Grandmother     Social History Social History   Tobacco Use   Smoking status: Never   Smokeless tobacco: Never  Substance Use Topics   Alcohol use: Yes    Alcohol/week: 0.0 standard drinks    Comment: ocass   Drug use: No     Review of Systems  Constitutional: No fever/chills Eyes:  No discharge ENT: No upper respiratory complaints. Respiratory: no cough. No SOB/ use of accessory muscles to breath Gastrointestinal:   No nausea, no vomiting.  No diarrhea.  No constipation. Genitourinary: Patient has vaginal bleeding.  Musculoskeletal: Negative for musculoskeletal pain. Skin: Negative for rash, abrasions, lacerations, ecchymosis.   ____________________________________________   PHYSICAL EXAM:  VITAL SIGNS: ED Triage Vitals  Enc Vitals Group     BP 04/29/21 1134 111/84     Pulse Rate 04/29/21 1134 (!) 107     Resp 04/29/21 1134 20     Temp 04/29/21 1134 98.5 F (36.9 C)     Temp Source 04/29/21 1134 Oral     SpO2 04/29/21 1134 100 %     Weight 04/29/21 1131 240 lb (108.9 kg)     Height 04/29/21 1131 5\' 7"  (1.702 m)     Head Circumference --      Peak Flow --      Pain Score 04/29/21 1131 7     Pain Loc --      Pain Edu? --      Excl. in  GC? --      Constitutional: Alert and oriented. Well appearing and in no acute distress. Eyes: Conjunctivae are normal. PERRL. EOMI. Head: Atraumatic. ENT: Cardiovascular: Normal rate, regular rhythm. Normal S1 and S2.  Good peripheral circulation. Respiratory: Normal respiratory effort without tachypnea or retractions. Lungs CTAB. Good air entry to the bases with no decreased or absent breath sounds Gastrointestinal: Bowel sounds x 4 quadrants. Soft and nontender to palpation. No guarding or rigidity. No distention. Musculoskeletal: Full range of motion to all extremities. No obvious deformities noted Neurologic:  Normal for age. No gross focal neurologic deficits are appreciated.  Skin:  Skin is warm, dry and intact. No  rash noted. Psychiatric: Mood and affect are normal for age. Speech and behavior are normal.   ____________________________________________   LABS (all labs ordered are listed, but only abnormal results are displayed)  Labs Reviewed  COMPREHENSIVE METABOLIC PANEL - Abnormal; Notable for the following components:      Result Value   Glucose, Bld 115 (*)    All other components within normal limits  CBC - Abnormal; Notable for the following components:   WBC 3.9 (*)    All other components within normal limits  URINALYSIS, COMPLETE (UACMP) WITH MICROSCOPIC - Abnormal; Notable for the following components:   Color, Urine AMBER (*)    APPearance CLOUDY (*)    Hgb urine dipstick SMALL (*)    Ketones, ur 5 (*)    Protein, ur 30 (*)    Leukocytes,Ua TRACE (*)    Bacteria, UA RARE (*)    All other components within normal limits  HCG, QUANTITATIVE, PREGNANCY - Abnormal; Notable for the following components:   hCG, Beta Chain, Quant, S 43,507 (*)    All other components within normal limits   ____________________________________________  EKG   ____________________________________________  RADIOLOGY Geraldo Pitter, personally viewed and evaluated these images (plain radiographs) as part of my medical decision making, as well as reviewing the written report by the radiologist.  US OB LESS THAN 14 WEEKS WITH OB TRANSVAGINAL  Result Date: 04/29/2021 CLINICAL DATA:  Vaginal bleeding for 2 days. EXAM: OBSTETRIC <14 WK [place] AND TRANSVAGINAL OB US TECHNIQUE: Both transabdominal and transvaginal ultrasound examinations were performed for complete evaluation of the gestation as well as the maternal uterus, adnexal regions, and pelvic cul-de-sac. Transvaginal technique was performed to assess early pregnancy. COMPARISON:  None. FINDINGS: Intrauterine gestational sac: Single Yolk sac:  Visualized. Embryo:  Visualized. Cardiac Activity: Visualized. Heart Rate: 169 bpm CRL: 16.1 mm   8 w   0 d                   [place] EDC: 12/09/2021 Subchorionic hemorrhage:  None visualized. Maternal uterus/adnexae: Normal. IMPRESSION: Single live intrauterine pregnancy corresponding to 8 weeks and 0 days gestation. Electronically Signed   By: Ted Mcalpine M.D.   On: 04/29/2021 14:58    ____________________________________________    PROCEDURES  Procedure(s) performed:     Procedures     Medications - No data to display   ____________________________________________   INITIAL IMPRESSION / ASSESSMENT AND PLAN / ED COURSE  Pertinent labs & imaging results that were available during my care of the patient were reviewed by me and considered in my medical decision making (see chart for details).      Assessment and plan Vaginal bleeding 30 year old female presents to the emergency department with vaginal bleeding for the past 2 days.  Patient was mildly tachycardic at triage but  vital signs were otherwise reassuring.  On exam, patient seemed to be resting comfortably.  Abdomen was soft and nontender without guarding.  Beta-hCG was appropriately elevated.  CBC indicated mild leukopenia but was otherwise reassuring.  CMP within reference range.  Urinalysis showed rare bacteria and trace leuks.  Patient's blood type is O+.  Dedicated OB ultrasound showed single viable intrauterine pregnancy at 8 weeks without subchorionic hematoma.  Fetal heart rate 169 bpm.  Recommended establishing care with an OB/GYN and starting prenatal vitamins.  Tylenol was recommended for pelvic cramping.  Recommended Keflex 4 times daily for the next 7 days for asymptomatic bacteria in pregnancy.  Return precautions were given to return to the emergency department with new or worsening symptoms.  All patient questions were answered.   ____________________________________________  FINAL CLINICAL IMPRESSION(S) / ED DIAGNOSES  Final diagnoses:  Vaginal bleeding affecting early pregnancy      NEW MEDICATIONS  STARTED DURING THIS VISIT:  ED Discharge Orders          Ordered    cephALEXin (KEFLEX) 500 MG capsule  4 times daily        04/29/21 1531                This chart was dictated using voice recognition software/Dragon. Despite best efforts to proofread, errors can occur which can change the meaning. Any change was purely unintentional.     Gasper Lloyd 04/29/21 1537    Jene Every, MD 04/29/21 [phone]

## 2021-04-29 NOTE — ED Triage Notes (Signed)
Pt reports found out she was pregnant by a home test  week ago and the last 2 days she has had pain and vaginal bleeding. Pt reports blood is brown in color and continues to get heavier.

## 2021-04-29 NOTE — Discharge Instructions (Addendum)
Take Keflex four times daily for the next seven days.  

## 2021-05-04 ENCOUNTER — Encounter
Admission: EM | Disposition: A | Discharge status: Home / Self Care | Payer: Self-pay | Source: Home / Self Care | Attending: Student in an Organized Health Care Education/Training Program

## 2021-05-04 ENCOUNTER — Encounter: Payer: Self-pay | Admitting: Emergency Medicine

## 2021-05-04 ENCOUNTER — Other Ambulatory Visit: Payer: Self-pay

## 2021-05-04 ENCOUNTER — Emergency Department: Payer: Self-pay

## 2021-05-04 ENCOUNTER — Emergency Department: Payer: Self-pay | Admitting: Anesthesiology

## 2021-05-04 ENCOUNTER — Ambulatory Visit
Admission: EM | Admit: 2021-05-04 | Discharge: 2021-05-04 | Disposition: A | Discharge status: Home / Self Care | Payer: Self-pay | Attending: Student in an Organized Health Care Education/Training Program | Admitting: Student in an Organized Health Care Education/Training Program

## 2021-05-04 DIAGNOSIS — O209 Hemorrhage in early pregnancy, unspecified: Secondary | ICD-10-CM

## 2021-05-04 DIAGNOSIS — Z20822 Contact with and (suspected) exposure to covid-19: Secondary | ICD-10-CM | POA: Insufficient documentation

## 2021-05-04 DIAGNOSIS — Z3A08 8 weeks gestation of pregnancy: Secondary | ICD-10-CM | POA: Insufficient documentation

## 2021-05-04 DIAGNOSIS — O034 Incomplete spontaneous abortion without complication: Secondary | ICD-10-CM

## 2021-05-04 HISTORY — PX: DILATION AND EVACUATION: SHX1459

## 2021-05-04 LAB — CBC WITH DIFFERENTIAL/PLATELET
Abs Immature Granulocytes: 0.01 10*3/uL (ref 0.00–0.07)
Basophils Absolute: 0 10*3/uL (ref 0.0–0.1)
Basophils Relative: 0 %
Eosinophils Absolute: 0.1 10*3/uL (ref 0.0–0.5)
Eosinophils Relative: 1 %
HCT: 38.7 % (ref 36.0–46.0)
Hemoglobin: 13.1 g/dL (ref 12.0–15.0)
Immature Granulocytes: 0 %
Lymphocytes Relative: 28 %
Lymphs Abs: 1.4 10*3/uL (ref 0.7–4.0)
MCH: 29.5 pg (ref 26.0–34.0)
MCHC: 33.9 g/dL (ref 30.0–36.0)
MCV: 87.2 fL (ref 80.0–100.0)
Monocytes Absolute: 0.3 10*3/uL (ref 0.1–1.0)
Monocytes Relative: 6 %
Neutro Abs: 3.3 10*3/uL (ref 1.7–7.7)
Neutrophils Relative %: 65 %
Platelets: 192 10*3/uL (ref 150–400)
RBC: 4.44 MIL/uL (ref 3.87–5.11)
RDW: 12.4 % (ref 11.5–15.5)
WBC: 5 10*3/uL (ref 4.0–10.5)
nRBC: 0 % (ref 0.0–0.2)

## 2021-05-04 LAB — BASIC METABOLIC PANEL
Anion gap: 8 (ref 5–15)
BUN: 7 mg/dL (ref 6–20)
CO2: 24 mmol/L (ref 22–32)
Calcium: 9.2 mg/dL (ref 8.9–10.3)
Chloride: 105 mmol/L (ref 98–111)
Creatinine, Ser: 0.63 mg/dL (ref 0.44–1.00)
GFR, Estimated: >60 mL/min (ref >60)
Glucose, Bld: 103 mg/dL — ABNORMAL HIGH (ref 70–99)
Potassium: 3.8 mmol/L (ref 3.5–5.1)
Sodium: 137 mmol/L (ref 135–145)

## 2021-05-04 LAB — RESP PANEL BY RT-PCR (FLU A&B, COVID) ARPGX2
Influenza A by PCR: NEGATIVE
Influenza B by PCR: NEGATIVE
SARS Coronavirus 2 by RT PCR: NEGATIVE

## 2021-05-04 LAB — TYPE AND SCREEN
ABO/RH(D): O POS
Antibody Screen: NEGATIVE

## 2021-05-04 LAB — HCG, QUANTITATIVE, PREGNANCY: hCG, Beta Chain, Quant, S: 37547 m[IU]/mL — ABNORMAL HIGH (ref <5)

## 2021-05-04 SURGERY — DILATION AND EVACUATION, UTERUS
Anesthesia: General

## 2021-05-04 MED ORDER — OXYCODONE HCL 5 MG/5ML PO SOLN
5.0000 mg | Freq: Once | ORAL | Status: DC | PRN
Start: 2021-05-04 — End: 2021-05-14

## 2021-05-04 MED ORDER — PROPOFOL 10 MG/ML IV BOLUS
INTRAVENOUS | Status: DC | PRN
  Administered 2021-05-04: 200 mg via INTRAVENOUS

## 2021-05-04 MED ORDER — 0.9 % SODIUM CHLORIDE (POUR BTL) OPTIME
TOPICAL | Status: DC | PRN
  Administered 2021-05-04: 500 mL

## 2021-05-04 MED ORDER — FENTANYL CITRATE (PF) 100 MCG/2ML IJ SOLN
INTRAMUSCULAR | Status: DC | PRN
  Administered 2021-05-04 (×2): 25 ug via INTRAVENOUS
  Administered 2021-05-04: 50 ug via INTRAVENOUS

## 2021-05-04 MED ORDER — TRAMADOL HCL 50 MG PO TABS: 50.0000 mg | ORAL_TABLET | Freq: Two times a day (BID) | ORAL | 0 refills | Status: AC | PRN

## 2021-05-04 MED ORDER — POVIDONE-IODINE 10 % EX SWAB: 2.0000 "application " | Freq: Once | CUTANEOUS | Status: DC

## 2021-05-04 MED ORDER — LIDOCAINE HCL (CARDIAC) PF 100 MG/5ML IV SOSY
PREFILLED_SYRINGE | INTRAVENOUS | Status: DC | PRN
  Administered 2021-05-04: 100 mg via INTRAVENOUS

## 2021-05-04 MED ORDER — PROPOFOL 500 MG/50ML IV EMUL
INTRAVENOUS | Status: AC
  Filled 2021-05-04: qty 50

## 2021-05-04 MED ORDER — FENTANYL CITRATE (PF) 100 MCG/2ML IJ SOLN
INTRAMUSCULAR | Status: AC
  Filled 2021-05-04: qty 2

## 2021-05-04 MED ORDER — ONDANSETRON HCL 4 MG/2ML IJ SOLN: 4.0000 mg | Freq: Once | INTRAMUSCULAR | Status: DC | PRN

## 2021-05-04 MED ORDER — LACTATED RINGERS IV SOLN: INTRAVENOUS | Status: DC | PRN

## 2021-05-04 MED ORDER — PROPOFOL 10 MG/ML IV BOLUS
INTRAVENOUS | Status: AC
  Filled 2021-05-04: qty 20

## 2021-05-04 MED ORDER — IBUPROFEN 600 MG PO TABS: 600.0000 mg | ORAL_TABLET | Freq: Four times a day (QID) | ORAL | 0 refills | Status: AC | PRN

## 2021-05-04 MED ORDER — DEXAMETHASONE SODIUM PHOSPHATE 10 MG/ML IJ SOLN
INTRAMUSCULAR | Status: AC
  Filled 2021-05-04: qty 1

## 2021-05-04 MED ORDER — DOXYCYCLINE HYCLATE 100 MG IV SOLR
200.0000 mg | INTRAVENOUS | Status: AC
  Administered 2021-05-04: 200 mg via INTRAVENOUS
  Filled 2021-05-04 (×2): qty 200

## 2021-05-04 MED ORDER — FENTANYL CITRATE (PF) 100 MCG/2ML IJ SOLN: 25.0000 ug | INTRAMUSCULAR | Status: DC | PRN

## 2021-05-04 MED ORDER — LACTATED RINGERS IV SOLN: INTRAVENOUS | Status: DC

## 2021-05-04 MED ORDER — ACETAMINOPHEN 10 MG/ML IV SOLN: 1000.0000 mg | Freq: Once | INTRAVENOUS | Status: DC | PRN

## 2021-05-04 MED ORDER — ONDANSETRON HCL 4 MG/2ML IJ SOLN
INTRAMUSCULAR | Status: AC
  Filled 2021-05-04: qty 2

## 2021-05-04 MED ORDER — DEXAMETHASONE SODIUM PHOSPHATE 10 MG/ML IJ SOLN
INTRAMUSCULAR | Status: DC | PRN
  Administered 2021-05-04: 10 mg via INTRAVENOUS

## 2021-05-04 MED ORDER — OXYCODONE HCL 5 MG PO TABS: 5.0000 mg | ORAL_TABLET | Freq: Once | ORAL | Status: DC | PRN

## 2021-05-04 MED ORDER — ONDANSETRON HCL 4 MG/2ML IJ SOLN
INTRAMUSCULAR | Status: DC | PRN
  Administered 2021-05-04: 4 mg via INTRAVENOUS

## 2021-05-04 MED ORDER — MIDAZOLAM HCL 2 MG/2ML IJ SOLN
INTRAMUSCULAR | Status: AC
  Filled 2021-05-04: qty 2

## 2021-05-04 MED ORDER — MIDAZOLAM HCL 2 MG/2ML IJ SOLN
INTRAMUSCULAR | Status: DC | PRN
Start: 2021-05-04 — End: 2021-05-04
  Administered 2021-05-04: 2 mg via INTRAVENOUS

## 2021-05-04 SURGICAL SUPPLY — 22 items
BACTOSHIELD CHG 4% 4OZ (MISCELLANEOUS) ×1
CATH ROBINSON RED A/P 16FR (CATHETERS) IMPLANT
FILTER UTR ASPR SPEC (MISCELLANEOUS) ×1 IMPLANT
FLTR UTR ASPR SPEC (MISCELLANEOUS) ×2
GAUZE 4X4 16PLY ~~LOC~~+RFID DBL (SPONGE) ×2 IMPLANT
GLOVE SURG SYN 6.5 ES PF (GLOVE) ×4 IMPLANT
GLOVE SURG UNDER POLY LF SZ6.5 (GLOVE) ×4 IMPLANT
GOWN STRL REUS W/ TWL LRG LVL3 (GOWN DISPOSABLE) ×2 IMPLANT
GOWN STRL REUS W/TWL LRG LVL3 (GOWN DISPOSABLE) ×2
KIT BERKELEY 1ST TRIMESTER 3/8 (MISCELLANEOUS) ×2 IMPLANT
KIT TURNOVER CYSTO (KITS) ×2 IMPLANT
NS IRRIG 500ML POUR BTL (IV SOLUTION) ×2 IMPLANT
PACK DNC HYST (MISCELLANEOUS) ×2 IMPLANT
PAD OB MATERNITY 4.3X12.25 (PERSONAL CARE ITEMS) ×2 IMPLANT
PAD PREP 24X41 OB/GYN DISP (PERSONAL CARE ITEMS) ×2 IMPLANT
SCRUB CHG 4% DYNA-HEX 4OZ (MISCELLANEOUS) ×1 IMPLANT
SET BERKELEY SUCTION TUBING (SUCTIONS) ×2 IMPLANT
TOWEL OR 17X26 4PK STRL BLUE (TOWEL DISPOSABLE) ×2 IMPLANT
VACURETTE 10 RIGID CVD (CANNULA) IMPLANT
VACURETTE 12 RIGID CVD (CANNULA) IMPLANT
VACURETTE 8 RIGID CVD (CANNULA) IMPLANT
VACURETTE 8MM F TIP (MISCELLANEOUS) ×2 IMPLANT

## 2021-05-04 NOTE — Anesthesia Procedure Notes (Signed)
Procedure Name: LMA Insertion Date/Time: 05/04/2021 6:46 PM Performed by: Karoline Caldwell, CRNA Pre-anesthesia Checklist: Patient identified, Patient being monitored, Timeout performed, Emergency Drugs available and Suction available Patient Re-evaluated:Patient Re-evaluated prior to induction Oxygen Delivery Method: Circle system utilized Preoxygenation: Pre-oxygenation with 100% oxygen Induction Type: IV induction Ventilation: Mask ventilation without difficulty LMA: LMA inserted LMA Size: 4.0 Tube type: Oral Number of attempts: 1 Placement Confirmation: positive ETCO2 and breath sounds checked- equal and bilateral Tube secured with: Tape Dental Injury: Teeth and Oropharynx as per pre-operative assessment

## 2021-05-04 NOTE — ED Notes (Signed)
Patient placed in hospital gown, belonging placed in bag (tshirt, sweatpants and bra)

## 2021-05-04 NOTE — Discharge Instructions (Signed)
AMBULATORY SURGERY  ?DISCHARGE INSTRUCTIONS ? ? ?The drugs that you were given will stay in your system until tomorrow so for the next 24 hours you should not: ? ?Drive an automobile ?Make any legal decisions ?Drink any alcoholic beverage ? ? ?You may resume regular meals tomorrow.  Today it is better to start with liquids and gradually work up to solid foods. ? ?You may eat anything you prefer, but it is better to start with liquids, then soup and crackers, and gradually work up to solid foods. ? ? ?Please notify your doctor immediately if you have any unusual bleeding, trouble breathing, redness and pain at the surgery site, drainage, fever, or pain not relieved by medication. ? ? ? ?Additional Instructions: ? ? ? ?Please contact your physician with any problems or Same Day Surgery at [phone], Monday through Friday 6 am to 4 pm, or [place] [facility] [place] [facility] number at [phone].  ?

## 2021-05-04 NOTE — Anesthesia Preprocedure Evaluation (Signed)
Anesthesia Evaluation  Patient identified by MRN, date of birth, ID band Patient awake  General Assessment Comment: Last NPO @ 1030 (8 hours prior)  Reviewed: Allergy & Precautions, NPO status , Patient's Chart, lab work & pertinent test results  History of Anesthesia Complications Negative for: history of anesthetic complications  Airway Mallampati: II  TM Distance: >3 FB Neck ROM: Full    Dental no notable dental hx. (+) Teeth Intact   Pulmonary neg pulmonary ROS, neg sleep apnea, neg COPD, Patient abstained from smoking.Not current smoker,    Pulmonary exam normal breath sounds clear to auscultation       Cardiovascular Exercise Tolerance: Good METS(-) hypertension(-) CAD and (-) Past MI negative cardio ROS  (-) dysrhythmias  Rhythm:Regular Rate:Normal - Systolic murmurs    Neuro/Psych negative neurological ROS  negative psych ROS   GI/Hepatic neg GERD  ,(+)     (-) substance abuse  ,   Endo/Other  neg diabetes  Renal/GU negative Renal ROS     Musculoskeletal   Abdominal   Peds  Hematology   Anesthesia Other Findings Past Medical History: No date: Medical history non-contributory  Reproductive/Obstetrics (+) Pregnancy Missed abortion, 8 weeks                             Anesthesia Physical Anesthesia Plan  ASA: 2  Anesthesia Plan: General   Post-op Pain Management:    Induction: Intravenous  PONV Risk Score and Plan: 4 or greater and Ondansetron, Dexamethasone and Midazolam  Airway Management Planned: Natural Airway and LMA  Additional Equipment: None  Intra-op Plan:   Post-operative Plan: Extubation in OR  Informed Consent: I have reviewed the patients History and Physical, chart, labs and discussed the procedure including the risks, benefits and alternatives for the proposed anesthesia with the patient or authorized representative who has indicated his/her  understanding and acceptance.     Dental advisory given  Plan Discussed with: CRNA and Surgeon  Anesthesia Plan Comments: (Discussed risks of anesthesia with patient, including PONV, sore throat, lip/dental damage. Rare risks discussed as well, such as cardiorespiratory and neurological sequelae. Patient understands.)        Anesthesia Quick Evaluation

## 2021-05-04 NOTE — ED Provider Notes (Signed)
[facility] Emergency Department Provider Note    Event Date/Time   First MD Initiated Contact with Patient 05/04/21 1249     (approximate)  I have reviewed the triage vital signs and the nursing notes.   HISTORY  Chief Complaint Vaginal Bleeding    HPI Regina Fischer is a 30 y.o. female G2P2002 roughly 2 weeks.  By LMP presents to the ER for evaluation of persistent vaginal spotting bleeding that started developed to passing clots crampy pain since last night.  States that she came to the ER due to discomfort and did pass what appeared to be tissue in the toilet.  States that since then her cramping and discomfort has subsided.  No fevers.     Past Medical History:  Diagnosis Date   Medical history non-contributory    Family History  Problem Relation Age of Onset   Diabetes Mother    Diabetes Maternal Grandmother    Kidney disease Maternal Grandmother    Past Surgical History:  Procedure Laterality Date   SMALL INTESTINE SURGERY  1994   at birth-pt unsure of any details   Patient Active Problem List   Diagnosis Date Noted   Labor and delivery, indication for care 09/18/2016   First trimester screening 03/04/2016      Prior to Admission medications   Medication Sig Start Date End Date Taking? Authorizing Provider  cephALEXin (KEFLEX) 500 MG capsule Take 1 capsule (500 mg total) by mouth 4 (four) times daily for 7 days. 04/29/21 05/06/21  Orvil Feil, PA-C  Prenatal Vit-Fe Fumarate-FA (PRENATAL MULTIVITAMIN) TABS tablet Take 1 tablet by mouth daily at 12 noon.    [provider]    Allergies Patient has no known allergies.    Social History Social History   Tobacco Use   Smoking status: Never   Smokeless tobacco: Never  Substance Use Topics   Alcohol use: Yes    Alcohol/week: 0.0 standard drinks    Comment: ocass   Drug use: No    Review of Systems Patient denies headaches, rhinorrhea, blurry vision,  numbness, shortness of breath, chest pain, edema, cough, abdominal pain, nausea, vomiting, diarrhea, dysuria, fevers, rashes or hallucinations unless otherwise stated above in HPI. ____________________________________________   PHYSICAL EXAM:  VITAL SIGNS: Vitals:   05/04/21 1235  BP: 116/74  Pulse: 70  Resp: 18  Temp: 98.4 F (36.9 C)  SpO2: 100%    Constitutional: Alert and oriented.  Eyes: Conjunctivae are normal.  Head: Atraumatic. Nose: No congestion/rhinnorhea. Mouth/Throat: Mucous membranes are moist.   Neck: No stridor. Painless ROM.  Cardiovascular: Normal rate, regular rhythm. Grossly normal heart sounds.  Good peripheral circulation. Respiratory: Normal respiratory effort.  No retractions. Lungs CTAB. Gastrointestinal: Soft and nontender. No distention. No abdominal bruits. No CVA tenderness. Genitourinary: Mod amount of persistent ooze from the cervical os few amount of dime size clots expelled from the os no products of conception noted. Musculoskeletal: No lower extremity tenderness nor edema.  No joint effusions. Neurologic:  Normal speech and language. No gross focal neurologic deficits are appreciated. No facial droop Skin:  Skin is warm, dry and intact. No rash noted. Psychiatric: Mood and affect are normal. Speech and behavior are normal.  ____________________________________________   LABS (all labs ordered are listed, but only abnormal results are displayed)  Results for orders placed or performed during the hospital encounter of 05/04/21 (from the past 24 hour(s))  CBC with Differential     Status: None  Collection Time: 05/04/21 12:38 PM  Result Value Ref Range   WBC 5.0 4.0 - 10.5 K/uL   RBC 4.44 3.87 - 5.11 MIL/uL   Hemoglobin 13.1 12.0 - 15.0 g/dL   HCT 16.1 09.6 - 04.5 %   MCV 87.2 80.0 - 100.0 fL   MCH 29.5 26.0 - 34.0 pg   MCHC 33.9 30.0 - 36.0 g/dL   RDW 40.9 81.1 - 91.4 %   Platelets 192 150 - 400 K/uL   nRBC 0.0 0.0 - 0.2 %    Neutrophils Relative % 65 %   Neutro Abs 3.3 1.7 - 7.7 K/uL   Lymphocytes Relative 28 %   Lymphs Abs 1.4 0.7 - 4.0 K/uL   Monocytes Relative 6 %   Monocytes Absolute 0.3 0.1 - 1.0 K/uL   Eosinophils Relative 1 %   Eosinophils Absolute 0.1 0.0 - 0.5 K/uL   Basophils Relative 0 %   Basophils Absolute 0.0 0.0 - 0.1 K/uL   Immature Granulocytes 0 %   Abs Immature Granulocytes 0.01 0.00 - 0.07 K/uL  Basic metabolic panel     Status: Abnormal   Collection Time: 05/04/21 12:38 PM  Result Value Ref Range   Sodium 137 135 - 145 mmol/L   Potassium 3.8 3.5 - 5.1 mmol/L   Chloride 105 98 - 111 mmol/L   CO2 24 22 - 32 mmol/L   Glucose, Bld 103 (H) 70 - 99 mg/dL   BUN 7 6 - 20 mg/dL   Creatinine, Ser 7.82 0.44 - 1.00 mg/dL   Calcium 9.2 8.9 - 95.6 mg/dL   GFR, Estimated >21 >30 mL/min   Anion gap 8 5 - 15  Type and screen [facility]     Status: None   Collection Time: 05/04/21 12:38 PM  Result Value Ref Range   ABO/RH(D) O POS    Antibody Screen NEG    Sample Expiration      05/07/2021,2359 Performed at [facility], [address] [place], [place] [address]   hCG, quantitative, pregnancy     Status: Abnormal   Collection Time: 05/04/21 12:38 PM  Result Value Ref Range   hCG, Beta Chain, Quant, S 37,547 (H) <5 mIU/mL   ____________________________________________ ____________________________________________  RADIOLOGY  I personally reviewed all radiographic images ordered to evaluate for the above acute complaints and reviewed radiology reports and findings.  These findings were personally discussed with the patient.  Please see medical record for radiology report.  ____________________________________________   PROCEDURES  Procedure(s) performed:  Procedures    Critical Care performed: no ____________________________________________   INITIAL IMPRESSION / ASSESSMENT AND PLAN / ED COURSE  Pertinent labs & imaging results that were  available during my care of the patient were reviewed by me and considered in my medical decision making (see chart for details).   DDX: miscarriage, incomplete ab  CARLETHIA MESQUITA is a 30 y.o. who presents to the ED with vaginal bleeding in early pregnancy with findings concerning for past products conception and miscarriage.  Hemodynamically stable.  Hemoglobin 13.  hCG is falling.  Ultrasound ordered and concerning for incomplete AB.  Will consult OB/GYN  Clinical Course as of 05/04/21 1634  Fri May 04, 2021  1522 discussed case in consultation with Dr. Gaynelle Arabian.  Patient offered option for conservative management medication therapy versus D&C.  Patient would prefer to proceed with D&C. [PR]    Clinical Course User Index [PR] Willy Eddy, MD   Patient evaluated by Dr. Jerene Pitch  at bedside. Remains hemodynamically stable.  Plan to proceed with D&C this afternoon.  The patient was evaluated in Emergency Department today for the symptoms described in the history of present illness. He/she was evaluated in the context of the global COVID-19 pandemic, which necessitated consideration that the patient might be at risk for infection with the SARS-CoV-2 virus that causes COVID-19. Institutional protocols and algorithms that pertain to the evaluation of patients at risk for COVID-19 are in a state of rapid change based on information released by regulatory bodies including the CDC and federal and state organizations. These policies and algorithms were followed during the patient's care in the ED.  As part of my medical decision making, I reviewed the following data within the electronic MEDICAL RECORD NUMBER Nursing notes reviewed and incorporated, Labs reviewed, notes from prior ED visits and [place] Controlled Substance Database   ____________________________________________   FINAL CLINICAL IMPRESSION(S) / ED DIAGNOSES  Final diagnoses:  Incomplete miscarriage      NEW MEDICATIONS STARTED  DURING THIS VISIT:  New Prescriptions   No medications on file     Note:  This document was prepared using Dragon voice recognition software and may include unintentional dictation errors.    Willy Eddy, MD 05/04/21 [phone]

## 2021-05-04 NOTE — Transfer of Care (Signed)
Immediate Anesthesia Transfer of Care Note  Patient: Regina Fischer  Procedure(s) Performed: DILATATION AND EVACUATION  Patient Location: PACU  Anesthesia Type:General  Level of Consciousness: sedated  Airway & Oxygen Therapy: Patient Spontanous Breathing and Patient connected to face mask oxygen  Post-op Assessment: Report given to RN and Post -op Vital signs reviewed and stable  Post vital signs: Reviewed and stable  Last Vitals:  Vitals Value Taken Time  BP 101/74 05/04/21 1915  Temp 36.2 C 05/04/21 1915  Pulse 66 05/04/21 1916  Resp 19 05/04/21 1916  SpO2 100 % 05/04/21 1916  Vitals shown include unvalidated device data.  Last Pain:  Vitals:   05/04/21 1915  TempSrc: Temporal  PainSc: 0-No pain         Complications: No notable events documented.

## 2021-05-04 NOTE — Anesthesia Postprocedure Evaluation (Signed)
Anesthesia Post Note  Patient: Regina Fischer  Procedure(s) Performed: DILATATION AND EVACUATION  Patient location during evaluation: PACU Anesthesia Type: General Level of consciousness: awake and alert Pain management: pain level controlled Vital Signs Assessment: post-procedure vital signs reviewed and stable Respiratory status: spontaneous breathing, nonlabored ventilation, respiratory function stable and patient connected to nasal cannula oxygen Cardiovascular status: blood pressure returned to baseline and stable Postop Assessment: no apparent nausea or vomiting Anesthetic complications: no   No notable events documented.   Last Vitals:  Vitals:   05/04/21 2000 05/04/21 2011  BP: 121/79   Pulse: 67 73  Resp: 17 (!) 22  Temp:  (!) 36.2 C  SpO2: 100% 100%    Last Pain:  Vitals:   05/04/21 2000  TempSrc:   PainSc: 0-No pain                 Corinda Gubler

## 2021-05-04 NOTE — ED Triage Notes (Signed)
Pt reports that she was here last week for the same, she is having bright red bleeding with clots. I was called in the bathroom in triage where pt has bled through her clothes. There was clots and tissue in the toilet. Her bleeding began last night again.

## 2021-05-04 NOTE — ED Notes (Signed)
See triage note  States she was seen Sunday with vaginal bleeding  States bleeding was light at that time  States this am bleeding is heavier and she was passing clots    States she was [redacted] weeks pregnant

## 2021-05-04 NOTE — Op Note (Signed)
  Operative Note  05/04/2021 7:13 PM  PRE-OP DIAGNOSIS: Incomplete abortion  POST-OP DIAGNOSIS: same  SURGEON: Amori Cooperman MD  ANESTHESIA: Choice   PROCEDURE: Procedure(s): DILATATION AND EVACUATION   ESTIMATED BLOOD LOSS: 50 mL   SPECIMENS: POC   COMPLICATIONS: none  DISPOSITION: PACU - hemodynamically stable.  CONDITION: stable  FINDINGS: Exam under anesthesia revealed a 8 wk size uterus without palpable adnexal masses.   INDICATION FOR PROCEDURE: Incomplete miscarriage  PROCEDURE IN DETAIL: After informed consent was obtained, the patient was taken to the operating room where anesthesia was obtained without difficulty. The patient was positioned in the dorsal lithotomy position with [employer]. Time out was performed and an exam under anesthesia was performed. The vagina, perineum, and lower abdomen were prepped and draped in a normal sterile fashion. The bladder was emptied with an I&O catheter. A speculum was placed into the vagina and the cervix was grasped with a single toothed tenaculum.  The cervix was dilated. The suction was then tested and found to be adequate, and a 8 flexible suction cannula was advanced into the uterine cavity. The suction was activated and the contents of the uterus were aspirated until no further tissue was obtained. The uterus was then curetted to gritty texture throughout.  At the end of the procedure bleeding was noted to be minimal.  All instruments were then removed from the vagina.The patient tolerated the procedure well. All sponge, instrument, and needle counts were correct. The patient was taken to the recovery room in good condition.   Adelene Idler MD [facility], [facility] 05/04/2021 7:13 PM

## 2021-05-04 NOTE — H&P (Signed)
Regina Fischer is an 30 y.o. female.   Chief Complaint: Vaginal bleeding HPI: She presents today with complaints of increased heavy vaginal bleeding.  She was waiting on the OR lobby and passed tissue which was appear to be consistent with products of conception per the ER physician.  She reports that she has had increased cramping and pain and bleeding today.  Although her bleeding started about 1 week ago.  She was seen on July 17 in the ER and had a pelvic ultrasound which showed an intrauterine pregnancy of 8 weeks 0 days gestation.  Today her pelvic ultrasound shows retained products of conception.  She reports that her bleeding has lessened but is still ongoing.  ER physician performed pelvic exam and reported a open cervical os.  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 She has never used smokeless tobacco. She reports current alcohol use. She reports that she does not use drugs.  Allergies: No Known Allergies  (Not in a hospital admission)   Results for orders placed or performed during the hospital encounter of 05/04/21 (from the past 48 hour(s))  CBC with Differential     Status: None   Collection Time: 05/04/21 12:38 PM  Result Value Ref Range   WBC 5.0 4.0 - 10.5 K/uL   RBC 4.44 3.87 - 5.11 MIL/uL   Hemoglobin 13.1 12.0 - 15.0 g/dL   HCT 99.8 33.8 - 25.0 %   MCV 87.2 80.0 - 100.0 fL   MCH 29.5 26.0 - 34.0 pg   MCHC 33.9 30.0 - 36.0 g/dL   RDW 53.9 76.7 - 34.1 %   Platelets 192 150 - 400 K/uL   nRBC 0.0 0.0 - 0.2 %   Neutrophils Relative % 65 %   Neutro Abs 3.3 1.7 - 7.7 K/uL   Lymphocytes Relative 28 %   Lymphs Abs  1.4 0.7 - 4.0 K/uL   Monocytes Relative 6 %   Monocytes Absolute 0.3 0.1 - 1.0 K/uL   Eosinophils Relative 1 %   Eosinophils Absolute 0.1 0.0 - 0.5 K/uL   Basophils Relative 0 %   Basophils Absolute 0.0 0.0 - 0.1 K/uL   Immature Granulocytes 0 %   Abs Immature Granulocytes 0.01 0.00 - 0.07 K/uL    Comment: Performed at [facility], [address] [place], [place] [address]  Basic metabolic panel     Status: Abnormal   Collection Time: 05/04/21 12:38 PM  Result Value Ref Range   Sodium 137 135 - 145 mmol/L   Potassium 3.8 3.5 - 5.1 mmol/L   Chloride 105 98 - 111 mmol/L   CO2 24 22 - 32 mmol/L   Glucose, Bld 103 (H) 70 - 99 mg/dL    Comment: Glucose reference range applies only to samples taken after fasting for at least 8 hours.   BUN 7 6 - 20 mg/dL   Creatinine, Ser 2.40 0.44 - 1.00 mg/dL   Calcium 9.2 8.9 - 97.3 mg/dL   GFR, Estimated >53 >29 mL/min    Comment: (NOTE) Calculated using the CKD-EPI Creatinine Equation (2021)  Anion gap 8 5 - 15    Comment: Performed at [facility], [address] [place], [place] [address]  Type and screen [facility]     Status: None   Collection Time: 05/04/21 12:38 PM  Result Value Ref Range   ABO/RH(D) O POS    Antibody Screen NEG    Sample Expiration      05/07/2021,2359 Performed at [facility], [address] [place], [place] [address]   hCG, quantitative, pregnancy     Status: Abnormal   Collection Time: 05/04/21 12:38 PM  Result Value Ref Range   hCG, Beta Chain, Quant, S 37,547 (H) <5 mIU/mL    Comment:          GEST. AGE      CONC.  (mIU/mL)   <=1 WEEK        5 - 50     2 WEEKS       50 - 500     3 WEEKS       100 - 10,000     4 WEEKS     1,000 - 30,000     5 WEEKS     3,500 - 115,000   6-8 WEEKS     12,000 - 270,000    12 WEEKS     15,000 - 220,000        FEMALE AND NON-PREGNANT FEMALE:     LESS THAN 5 mIU/mL Performed at [facility], [address] [place], [place] [address]    [place] Comp Less 14 Wks  Result Date: 05/04/2021 CLINICAL DATA:  Vaginal bleeding.  Dropping quantitative beta HCG. EXAM: OBSTETRIC <14 WK [place] AND TRANSVAGINAL OB US TECHNIQUE: Both transabdominal and transvaginal ultrasound examinations were performed for complete evaluation of the gestation as well as the maternal uterus, adnexal regions, and pelvic cul-de-sac. Transvaginal technique was performed to assess early pregnancy. COMPARISON:  04/29/2021 FINDINGS: Intrauterine gestational sac: Absent Yolk sac:  Absent Embryo:  Absent Cardiac Activity: N/A Maternal uterus/adnexae: Thick heterogeneous material in the endometrial canal measuring up to 3 cm in thickness likely representing blood products, without appreciable internal color flow. Approximately 1.2 cm corpus luteum in the left ovary, the ovaries appear otherwise unremarkable. IMPRESSION: 1. Absent intrauterine gestational sac compatible with pregnancy loss. 2. Thick heterogeneous material along the endometrial canal without visible internal Doppler flow, favoring blood products. This does not have the overt masslike appearance of retained products of conception; however, if abnormal bleeding persists or if quantitative beta HCG fails to continue to drop off, thin imaging reassessment would be suggested. Electronically Signed   By: Gaylyn Rong M.D.   On: 05/04/2021 14:34    Review of Systems  Constitutional:  Negative for chills and fever.  HENT:  Negative for congestion, hearing loss and sinus pain.   Respiratory:  Negative for cough, shortness of breath and wheezing.   Cardiovascular:  Negative for chest pain, palpitations and leg swelling.  Gastrointestinal:  Negative for abdominal pain, constipation, diarrhea, nausea and vomiting.  Genitourinary:  Negative for dysuria, flank pain, frequency, hematuria and urgency.  Musculoskeletal:  Negative for back pain.  Skin:  Negative for rash.  Neurological:  Negative  for dizziness and headaches.  Psychiatric/Behavioral:  Negative for suicidal ideas. The patient is not nervous/anxious.    Blood pressure 116/74, pulse 70, temperature 98.4 F (36.9 C), temperature source Oral, resp. rate 18, height 5\' 7"  (1.702 m), weight 108.9 kg, last menstrual period 02/24/2021,  SpO2 100 %, unknown if currently breastfeeding. Physical Exam Vitals and nursing note reviewed.  Constitutional:      Appearance: Normal appearance. She is well-developed.  HENT:     Head: Normocephalic and atraumatic.  Cardiovascular:     Rate and Rhythm: Normal rate and regular rhythm.  Pulmonary:     Effort: Pulmonary effort is normal.     Breath sounds: Normal breath sounds.  Abdominal:     General: Bowel sounds are normal.     Palpations: Abdomen is soft.  Musculoskeletal:        General: Normal range of motion.  Skin:    General: Skin is warm and dry.  Neurological:     Mental Status: She is alert and oriented to person, place, and time.  Psychiatric:        Behavior: Behavior normal.        Thought Content: Thought content normal.        Judgment: Judgment normal.     Assessment/Plan 30 year old G3, P2 at 8 weeks 5 days gestation with incomplete miscarriage.  Condolences for loss were offered.   The patient was counseled that eghty percent (80%) of people who have a miscarriage will place a pregnancy completely in 8 weeks without intervention. Taking cytotec will increase this percentage to 85-90%.  Taking cytotec at home can be associated with uterine bleeding and cramping which the patient will need to be prepared to have within 1-4 hours of taking the medication. A support person should be present in case of an emergency. This is an affordable and safe alternative to a dilation and evacuation if precautions are taken.  A patient will need to follow up 2-4 weeks after taking cytotec to make sure that the pregnancy has completely passed. Another option for management of a  incomplete or missed abortion is a dilation and evacuation. Having a dilation and evacuation of the pregnancy will result in a nearly 100% removal of the tissue. Complications such as hemorrhage, infection, uterine perforation, retained tissue or uterine scarring are possible but not common. Risks and benefits of each option discussed. Patient elected for dilation and curettage.  OR was called and patient was scheduled for this procedure later this evening.  She last ate at 10:30 AM.   O POS  Natale Milch, MD 05/04/2021, 3:39 PM

## 2021-05-05 ENCOUNTER — Encounter: Payer: Self-pay | Admitting: Obstetrics and Gynecology

## 2021-05-08 LAB — SURGICAL PATHOLOGY

## 2021-05-16 ENCOUNTER — Encounter: Payer: Self-pay | Admitting: Obstetrics and Gynecology

## 2021-05-16 ENCOUNTER — Other Ambulatory Visit: Payer: Self-pay

## 2021-05-16 ENCOUNTER — Ambulatory Visit (INDEPENDENT_AMBULATORY_CARE_PROVIDER_SITE_OTHER): Payer: Self-pay | Admitting: Obstetrics and Gynecology

## 2021-05-16 ENCOUNTER — Other Ambulatory Visit (HOSPITAL_COMMUNITY)
Admission: RE | Admit: 2021-05-16 | Discharge: 2021-05-16 | Disposition: A | Discharge status: Home / Self Care | Payer: Self-pay | Source: Ambulatory Visit | Attending: Obstetrics and Gynecology | Admitting: Obstetrics and Gynecology

## 2021-05-16 VITALS — BP 120/70 | Ht 67.0 in | Wt 229.2 lb

## 2021-05-16 DIAGNOSIS — Z124 Encounter for screening for malignant neoplasm of cervix: Secondary | ICD-10-CM | POA: Insufficient documentation

## 2021-05-16 DIAGNOSIS — Z9889 Other specified postprocedural states: Secondary | ICD-10-CM

## 2021-05-16 DIAGNOSIS — Z30011 Encounter for initial prescription of contraceptive pills: Secondary | ICD-10-CM

## 2021-05-16 DIAGNOSIS — O039 Complete or unspecified spontaneous abortion without complication: Secondary | ICD-10-CM

## 2021-05-16 LAB — POCT URINALYSIS DIPSTICK
Bilirubin, UA: NEGATIVE
Blood, UA: NEGATIVE
Glucose, UA: NEGATIVE
Leukocytes, UA: NEGATIVE
Nitrite, UA: NEGATIVE
Protein, UA: NEGATIVE
Spec Grav, UA: >=1.03 — AB (ref 1.010–1.025)
Urobilinogen, UA: 0.2 E.U./dL
pH, UA: 5 (ref 5.0–8.0)

## 2021-05-16 MED ORDER — NORETHIN ACE-ETH ESTRAD-FE 1-20 MG-MCG PO TABS: 1.0000 | ORAL_TABLET | Freq: Every day | ORAL | 11 refills | Status: AC

## 2021-05-16 NOTE — Patient Instructions (Signed)
Oral Contraception Information Oral contraceptive pills (OCPs) are medicines taken by mouth to prevent pregnancy. They work by: Preventing the ovaries from releasing eggs. Thickening mucus in the lower part of the uterus (cervix). This prevents sperm from entering the uterus. Thinning the lining of the uterus (endometrium). This prevents a fertilized egg from attaching to the endometrium. OCPs are highly effective when taken exactly as prescribed. However, OCPs do not prevent STIs (sexually transmitted infections). Using condoms while on an OCP can help prevent STIs. What happens before starting OCPs? Before you start taking OCPs: You may have a physical exam, blood test, and Pap test. Your health care provider will make sure you are a good candidate for oral contraception. OCPs are not a good option for certain women, such as: Women who smoke and are older than age 35. Women who have or have had certain conditions, such as: A history of high blood pressure. Deep vein thrombosis. Pulmonary embolism. Stroke. Cardiovascular disease. Peripheral vascular disease. Ask your health care provider about the possible side effects of the OCP you may be prescribed. Be aware that it can take 2-3 months for your body to adjustto changes in hormone levels. Types of oral contraception  Birth control pills contain the hormones estrogen and progestin (synthetic progesterone) or progestin only. The combination pill This type of pill contains estrogen and progestin hormones. Conventional contraception pills come in packs of 21 or 28 pills. Some packs with 28-day pills contain estrogen and progestin for the first 21-24 days. Hormone-free tablets, called placebos, are taken for the final 4-7 days. You should have menstrual bleeding during the time you take the placebos. In packs with 21 tablets, you take no pills for 7 days. Menstrual bleeding occurs during these days. (Some people prefer taking a pill for 28  days to help establish a routine). Extended-interval contraception pills come in packs of 91 pills. The first 84 tablets have both estrogen and progestin. The last 7 pills are placebos. Menstrual bleeding occurs during the placebo days. With this schedule, menstrual bleeding happens once every 3 months. Continuous contraception pills come in packs of 28 pills. All pills in the pack contain estrogen and progestin. With this schedule, regular menstrual bleeding does not happen, but there may be spotting or irregular bleeding. Progestin-only pills This type of pill is often called the mini-pill and contains the progestin hormone only. It comes in packs of 28 pills. In some packs, the last 4 pills are placebos. The pill must be taken at the same time every day. This is very important to prevent pregnancy. Menstrual bleeding may not be regular orpredictable. What are the advantages? Oral contraception provides reliable and continuous contraception if taken as directed. It may treat or decrease symptoms of: Menstrual period cramps. Irregular menstrual cycle or bleeding. Heavy menstrual flow. Abnormal uterine bleeding. Acne, depending on the type of pill. Polycystic ovarian syndrome (POS). Endometriosis. Iron deficiency anemia. Premenstrual symptoms, including severe irritability, depression, or anxiety. It also may: Reduce the risk of endometrial and ovarian cancer. Be used as emergency contraception. Prevent ectopic pregnancies and infections of the fallopian tubes. What can make OCPs less effective? OCPs may be less effective if: You forget to take the pill every day. For progestin-only pills, it is especially important to take the pill at the same time each day. Even taking it 3 hours late can increase the risk of pregnancy. You have a stomach or intestinal disease that reduces your body's ability to absorb the pill. You take   OCPs with other medicines that make OCPs less effective, such as  antibiotics, certain HIV medicines, and some seizure medicines. You take expired OCPs. You forget to restart the pill after 7 days of not taking it. This refers to the packs of 21 pills. What are the side effects and risks? OCPs can sometimes cause side effects, such as: Headache. Depression. Trouble sleeping. Nausea and vomiting. Breast tenderness. Irregular bleeding or spotting during the first several months. Bloating or fluid retention. Increase in blood pressure. Combination pills may slightly increase the risk of: Blood clots. Heart attack. Stroke. Follow these instructions at home: Follow instructions from your health care provider about how to start taking your first cycle of OCPs. Depending on when you start the pill, you may need to use a backup form of birth control, such as condoms, during the first week.Make sure you know what steps to take if you forget to take the pill. Summary Oral contraceptive pills (OCPs) are medicines taken by mouth to prevent pregnancy. They are highly effective when taken exactly as prescribed. OCPs contain a combination of the hormones estrogen and progestin (synthetic progesterone) or progestin only. Before you start taking the pill, you may have a physical exam, blood test, and Pap test. Your health care provider will make sure you are a good candidate for oral contraception. The combination pill may come in a 21-day pack, a 28-day pack, or a 91-day pack. Progestin-only pills come in packs of 28 pills. OCPs can sometimes cause side effects, such as headache, nausea, breast tenderness, or irregular bleeding. This information is not intended to replace advice given to you by your health care provider. Make sure you discuss any questions you have with your healthcare provider. Document Revised: 06/30/2020 Document Reviewed: 06/08/2020 Elsevier Patient Education  2022 [employer]vier Inc.  

## 2021-05-16 NOTE — Progress Notes (Signed)
  Postoperative Follow-up Patient presents post op from D&E  for  missed abortion , 1 week ago.  Subjective: Patient reports marked improvement in her preop symptoms. Eating a regular diet without difficulty. The patient is not having any pain.  Activity: normal activities of daily living. Patient reports additional symptom's since surgery of small vaginal bleeding now resolved. She would like to start and OCP.  Objective: BP 120/70   Ht 5\' 7"  (1.702 m)   Wt 229 lb 3.2 oz (104 kg)   LMP 02/24/2021 (Exact Date)   BMI 35.90 kg/m  Physical Exam Constitutional:      Appearance: Normal appearance. She is well-developed.  Genitourinary:     Genitourinary Comments: External: Normal appearing vulva. No lesions noted.  Speculum examination: Normal appearing cervix. No blood in the vaginal vault. no discharge.    HENT:     Head: Normocephalic and atraumatic.  Eyes:     Extraocular Movements: Extraocular movements intact.     Pupils: Pupils are equal, round, and reactive to light.  Neck:     Thyroid: No thyromegaly.  Cardiovascular:     Rate and Rhythm: Normal rate and regular rhythm.     Heart sounds: Normal heart sounds.  Pulmonary:     Effort: Pulmonary effort is normal.     Breath sounds: Normal breath sounds.  Abdominal:     General: Bowel sounds are normal. There is no distension.     Palpations: Abdomen is soft. There is no mass.  Musculoskeletal:     Cervical back: Neck supple.  Neurological:     Mental Status: She is alert and oriented to person, place, and time.  Skin:    General: Skin is warm and dry.  Psychiatric:        Behavior: Behavior normal.        Thought Content: Thought content normal.        Judgment: Judgment normal.  Vitals reviewed.    Assessment: s/p :  D&E stable  Plan: Patient has done well after surgery with no apparent complications.  I have discussed the post-operative course to date, and the expected progress moving forward.  The patient  understands what complications to be concerned about.  I will see the patient in routine follow up, or sooner if needed.    Pap smear today Rx for Junel sent.  Activity plan: No restriction. Pelvic rest.  02/26/2021 MD, [facility], [facility] 05/16/2021 1:39 PM

## 2021-05-25 LAB — CYTOLOGY - PAP
Chlamydia: NEGATIVE
Comment: NEGATIVE
Comment: NEGATIVE
Comment: NEGATIVE
Comment: NORMAL
Diagnosis: UNDETERMINED — AB
High risk HPV: NEGATIVE
Neisseria Gonorrhea: NEGATIVE
Trichomonas: NEGATIVE

## 2021-12-02 IMAGING — US US PELVIS COMPLETE WITH TRANSVAGINAL
1 series · 14 of 25 positions shown · non-contrast
Comparison: None

CLINICAL DATA: Irregular bleeding after elective abortion.

EXAM:
TRANSABDOMINAL AND TRANSVAGINAL ULTRASOUND OF PELVIS
TECHNIQUE: Both transabdominal and transvaginal ultrasound examinations of the
pelvis were performed. Transabdominal technique was performed for
global imaging of the pelvis including uterus, ovaries, adnexal
regions, and pelvic cul-de-sac. It was necessary to proceed with
endovaginal exam following the transabdominal exam to visualize the
endometrium and ovaries.

[Series 1: us pelvic complete with transvaginal · 14 of 144 slices shown]
[im 1/144]
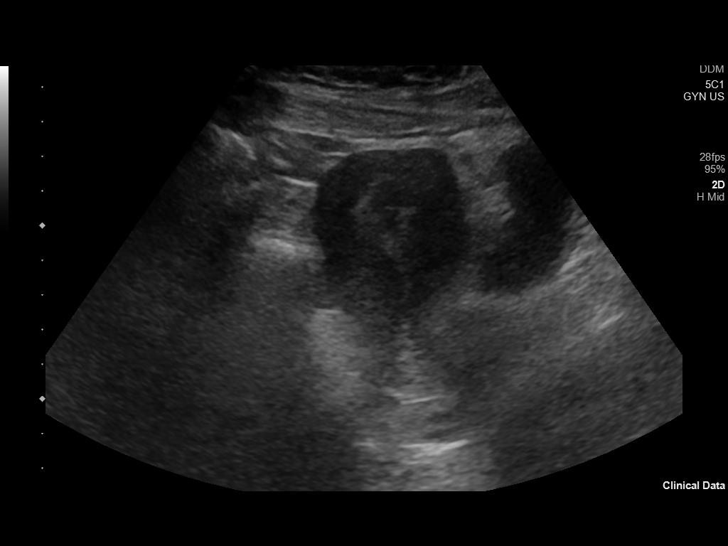
[im 12/144]
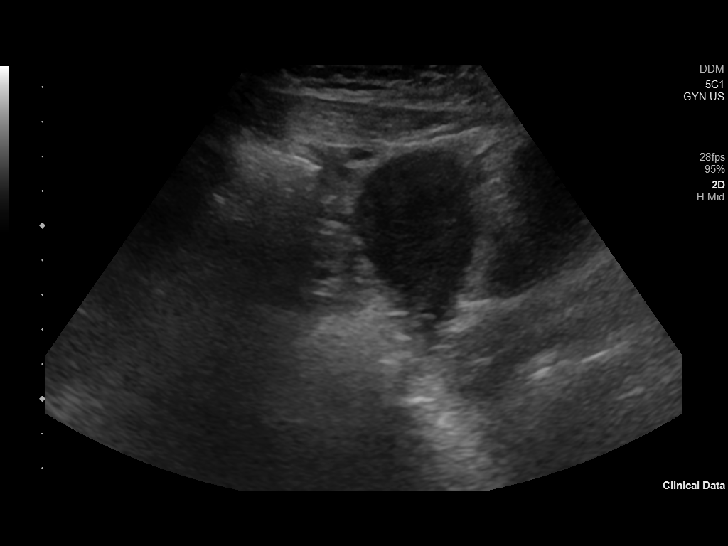
[im 24/144]
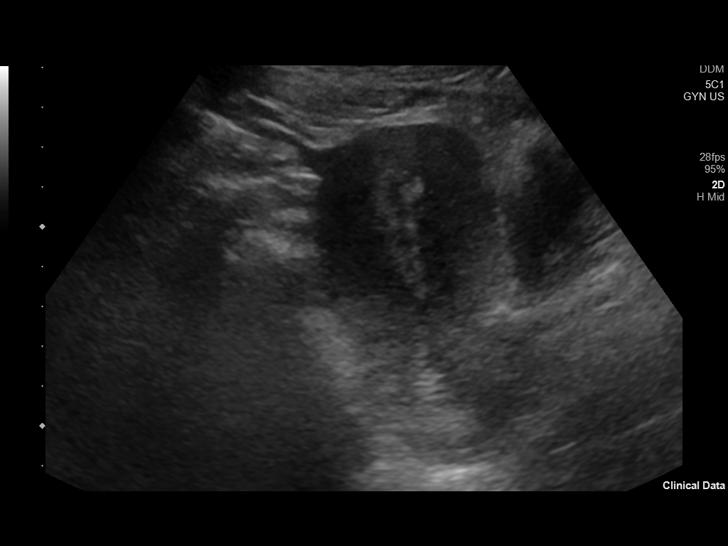
[im 36/144]
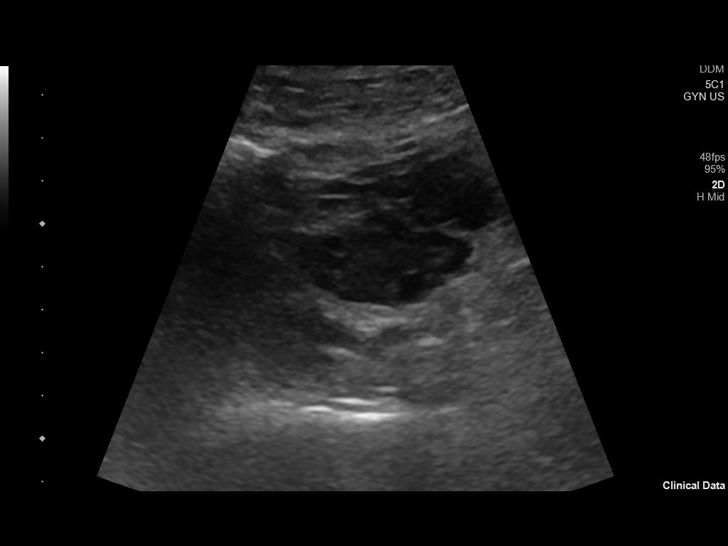
[im 48/144]
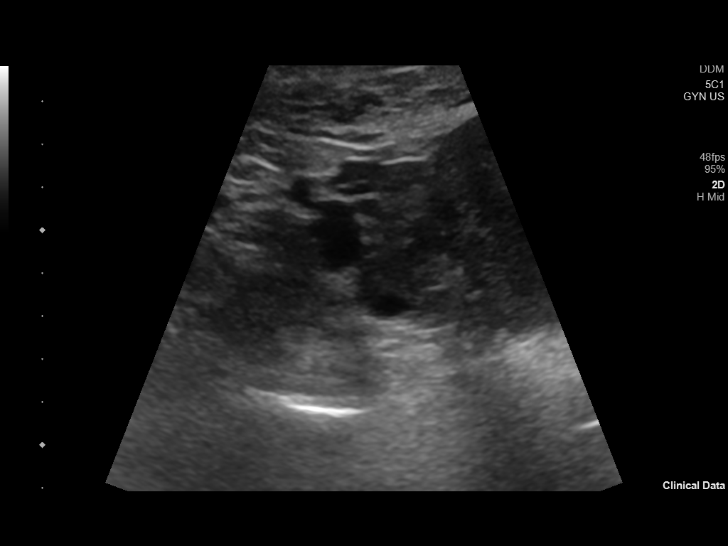
[im 54/144]
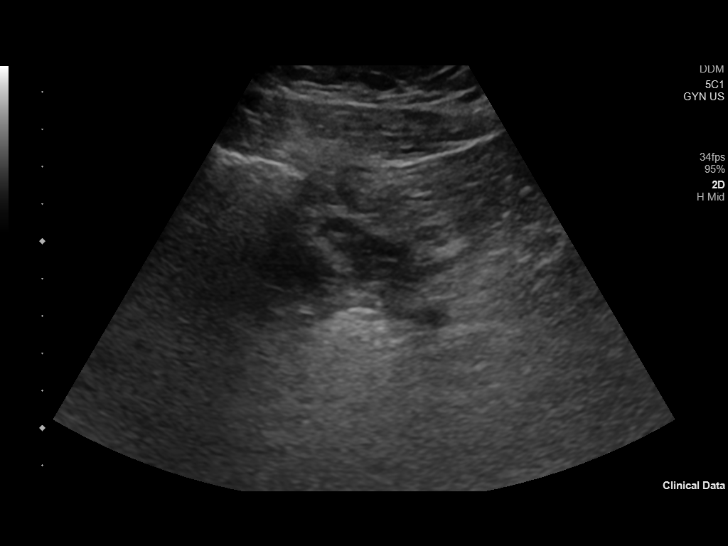
[im 66/144]
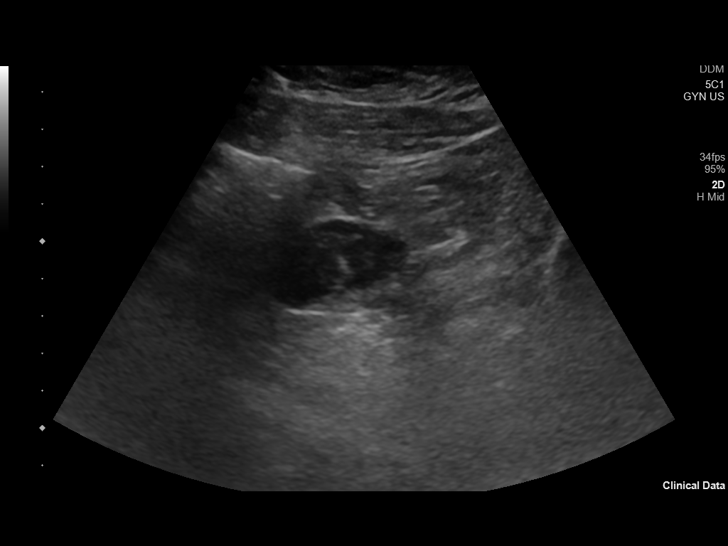
[im 78/144]
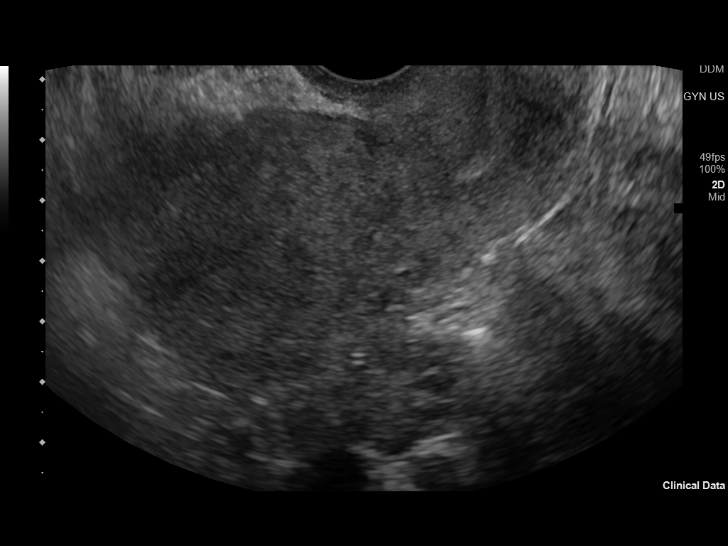
[im 90/144]
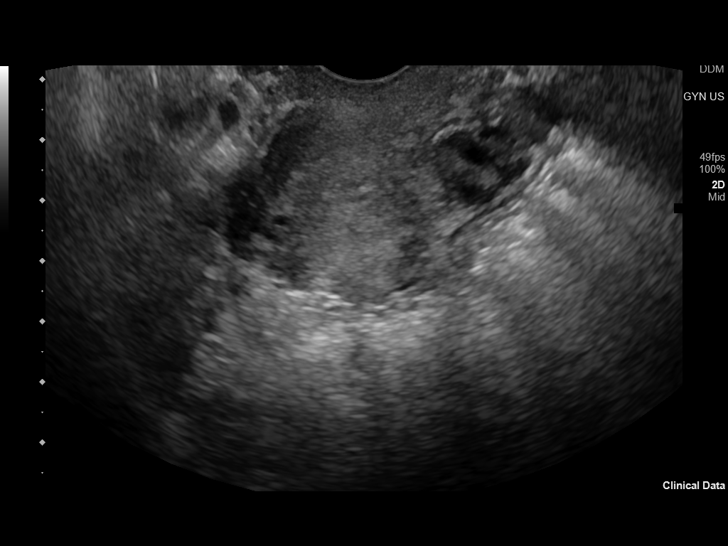
[im 96/144]
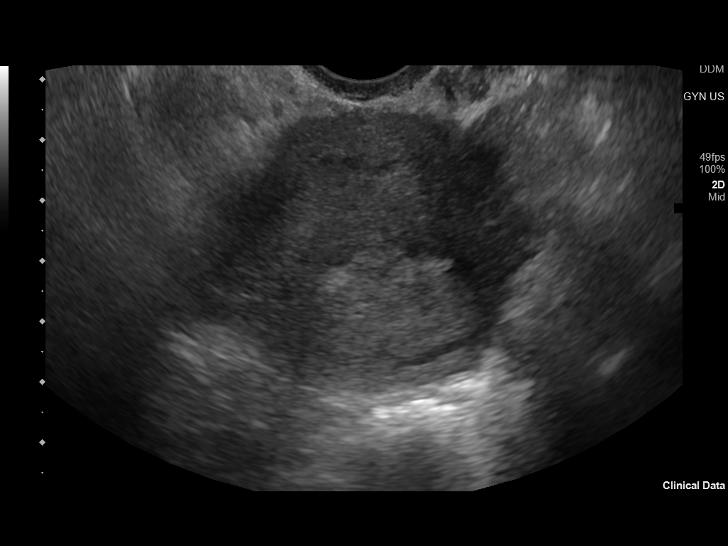
[im 108/144]
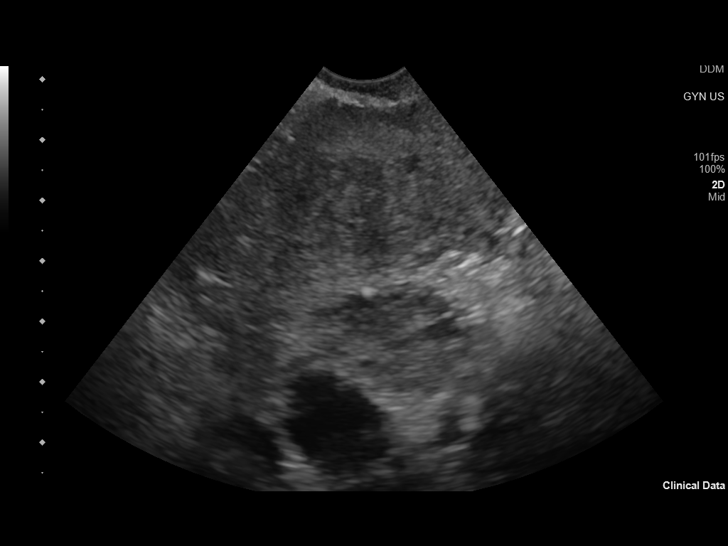
[im 120/144]
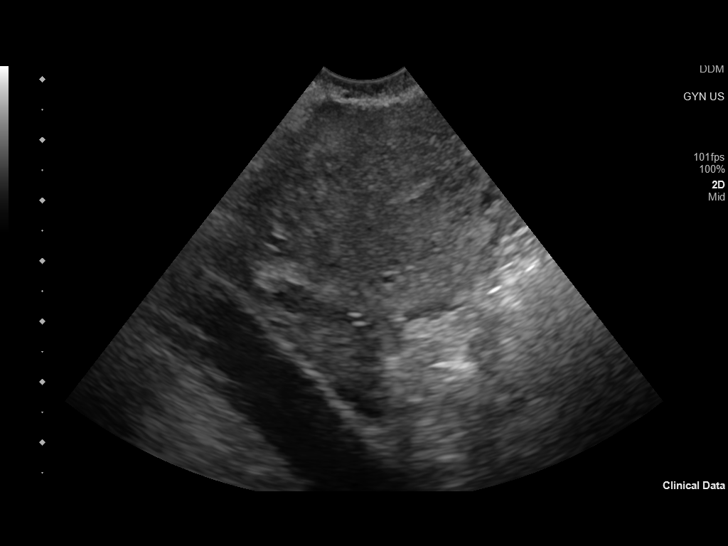
[im 132/144]
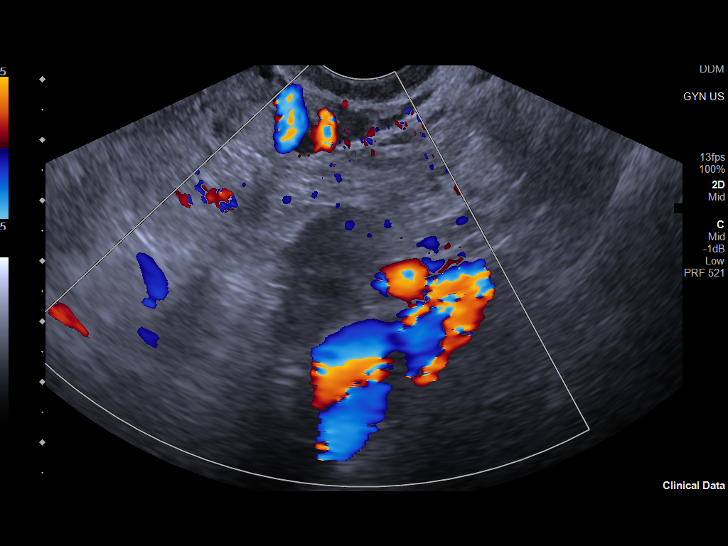
[im 144/144]
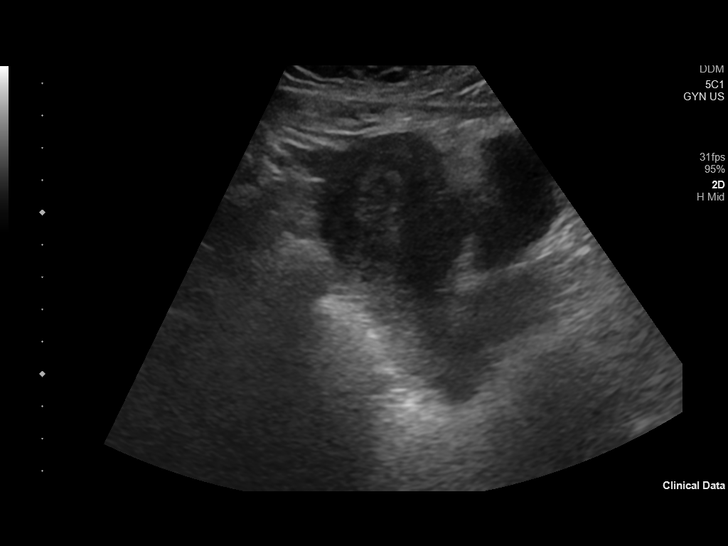

[14 of 25 positions shown; findings below may reference images not displayed]

FINDINGS: Uterus

Measurements: 8.9 x 5.1 x 5.0 cm = volume: 119 mL. No fibroids or
other mass visualized.

Endometrium

Thickness: 12 mm which is within normal limits for patient of
reproductive age. No focal abnormality visualized.

Right ovary

Measurements: 2.7 x 2.0 x 1.9 cm = volume: 5 mL. Normal
appearance/no adnexal mass.

Left ovary

Measurements: 2.8 x 1.5 x 1.4 cm = volume: 3 mL. Normal
appearance/no adnexal mass.

Other findings

No abnormal free fluid.
IMPRESSION: No definite abnormality seen in the pelvis.

## 2022-03-29 IMAGING — US US OB < 14 WEEKS - US OB TV
1 series · 14 of 28 positions shown · non-contrast
Comparison: None.

CLINICAL DATA: Vaginal bleeding for 2 days.

EXAM:
OBSTETRIC <14 WK US AND TRANSVAGINAL OB US
TECHNIQUE: Both transabdominal and transvaginal ultrasound examinations were
performed for complete evaluation of the gestation as well as the
maternal uterus, adnexal regions, and pelvic cul-de-sac.
Transvaginal technique was performed to assess early pregnancy.

[Series 1: us ob less than 14 weeks with ob transvaginal · 14 of 132 slices shown]
[im 5/132]
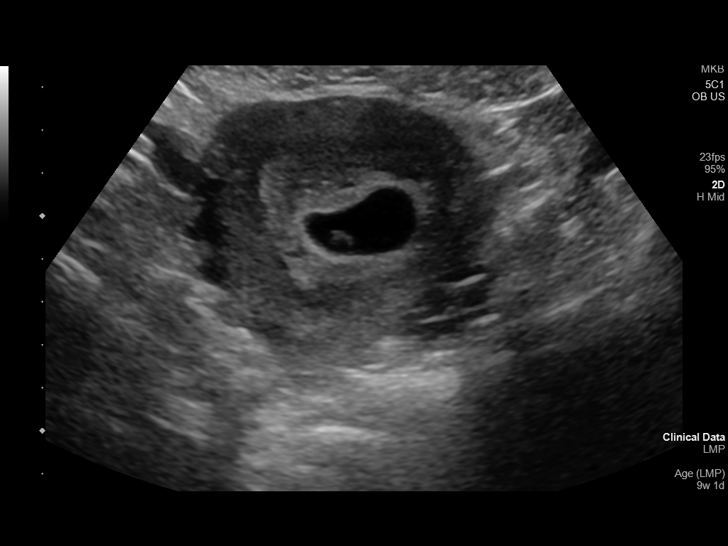
[im 15/132]
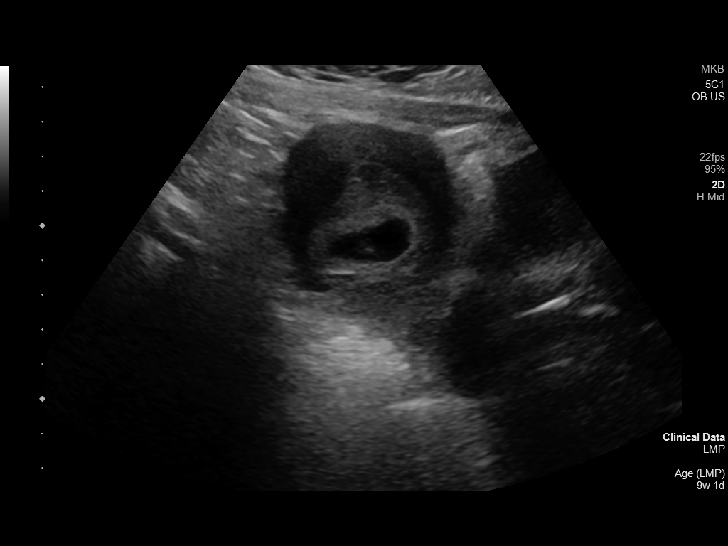
[im 25/132]
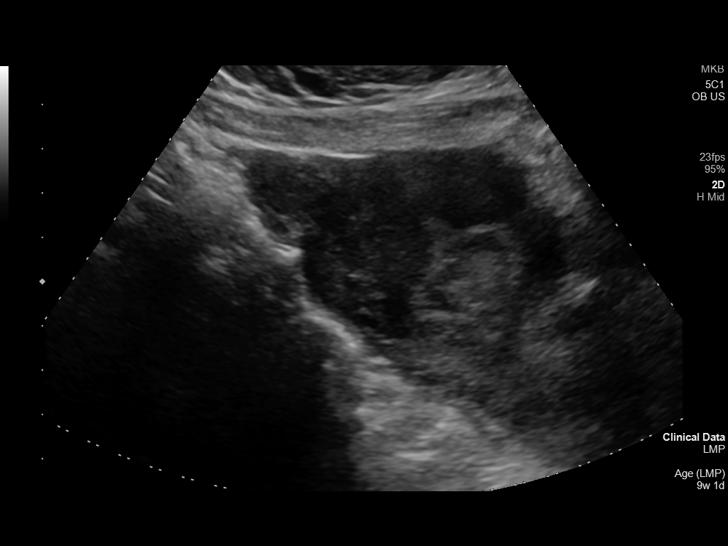
[im 34/132]
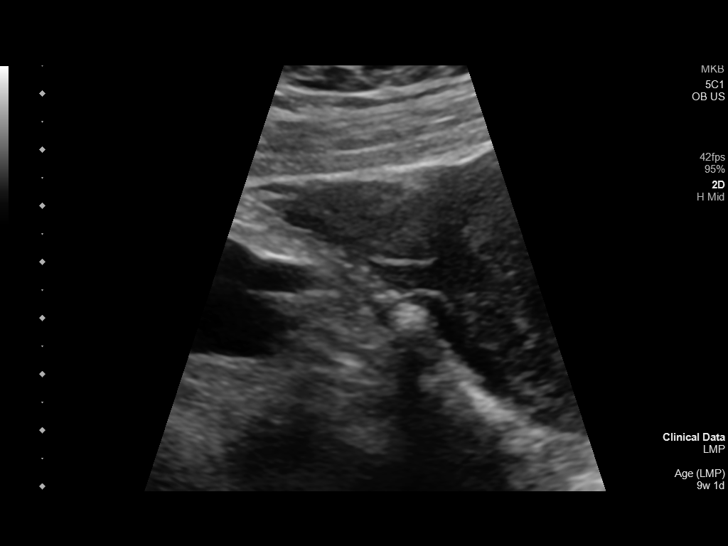
[im 44/132]
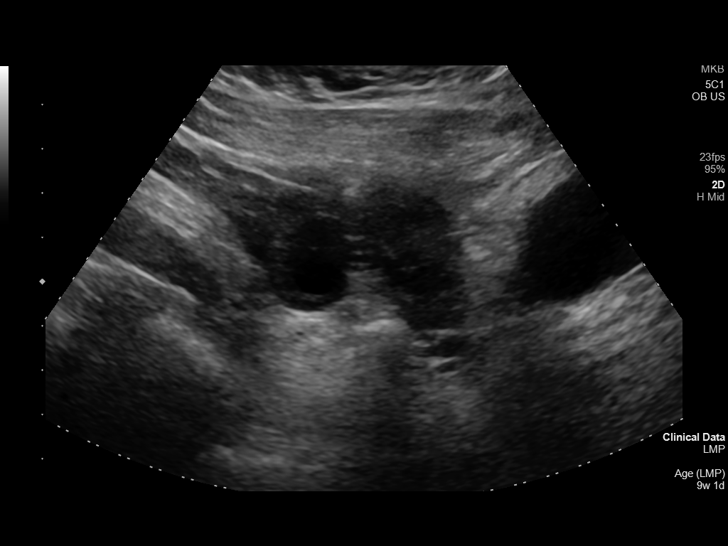
[im 54/132]
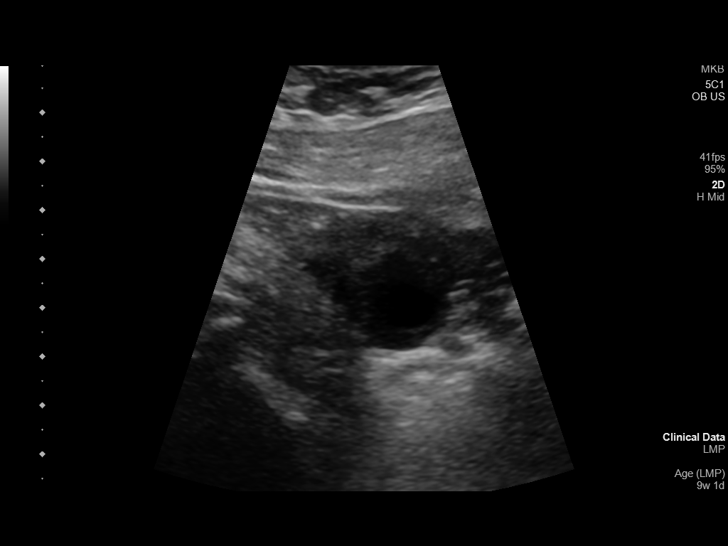
[im 64/132]
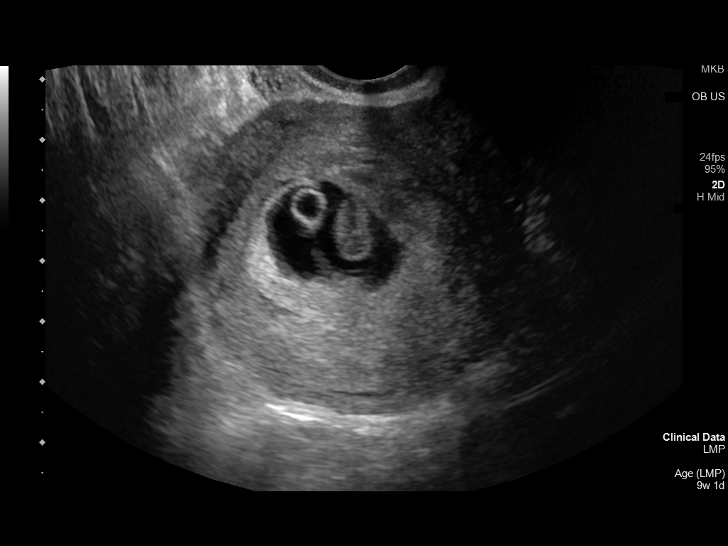
[im 73/132]
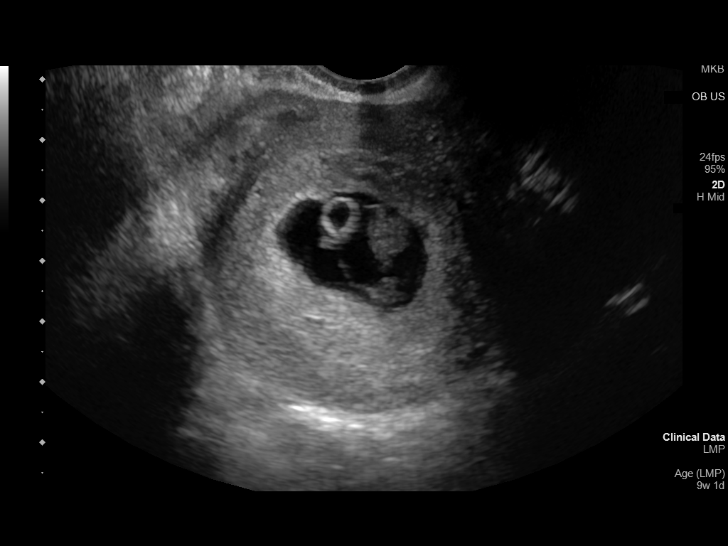
[im 83/132]
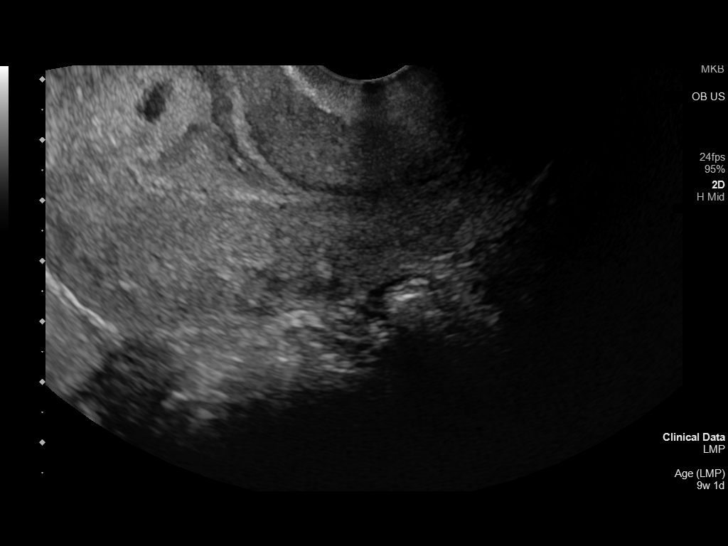
[im 93/132]
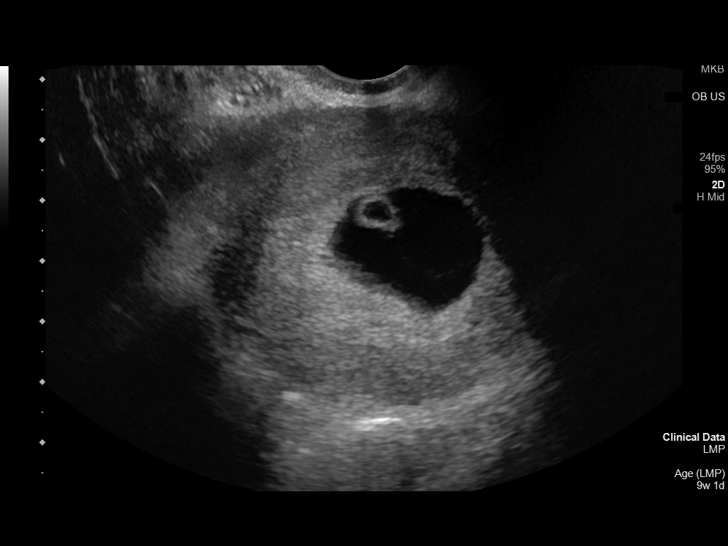
[im 102/132]
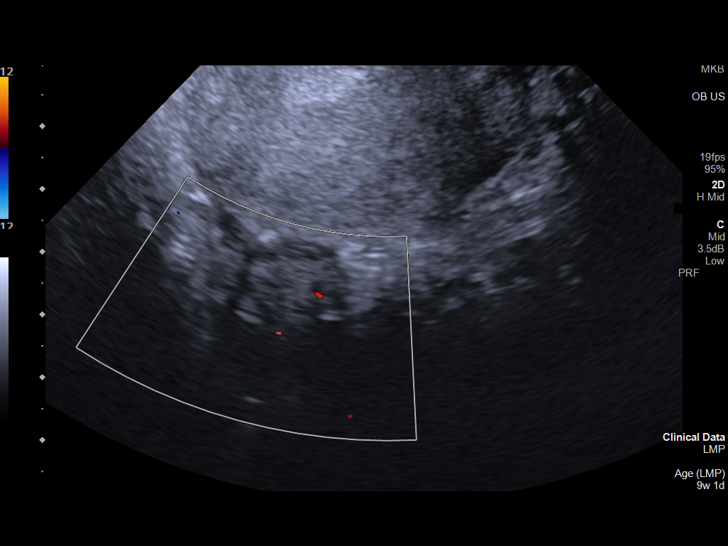
[im 112/132]
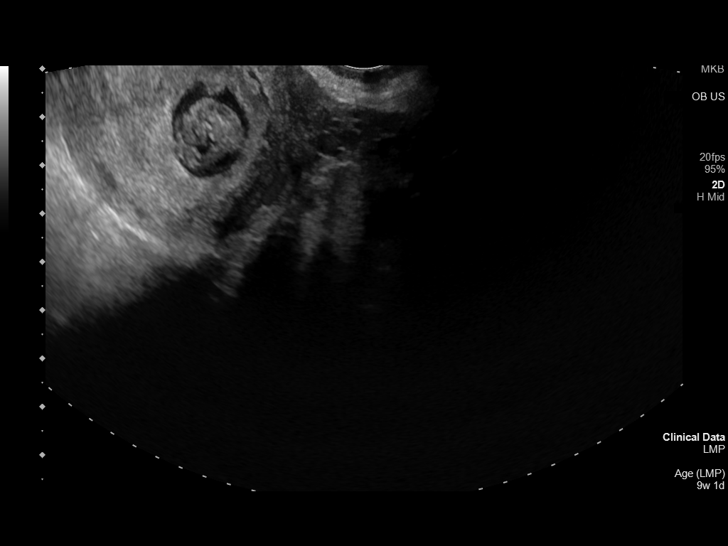
[im 122/132]
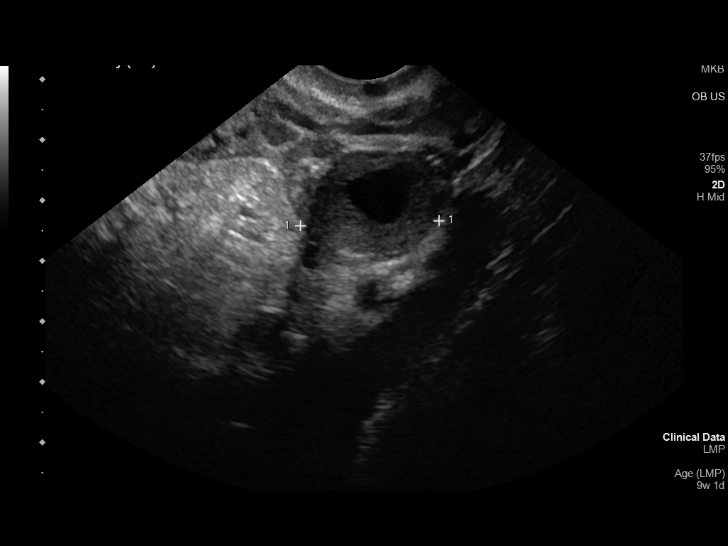
[im 132/132]
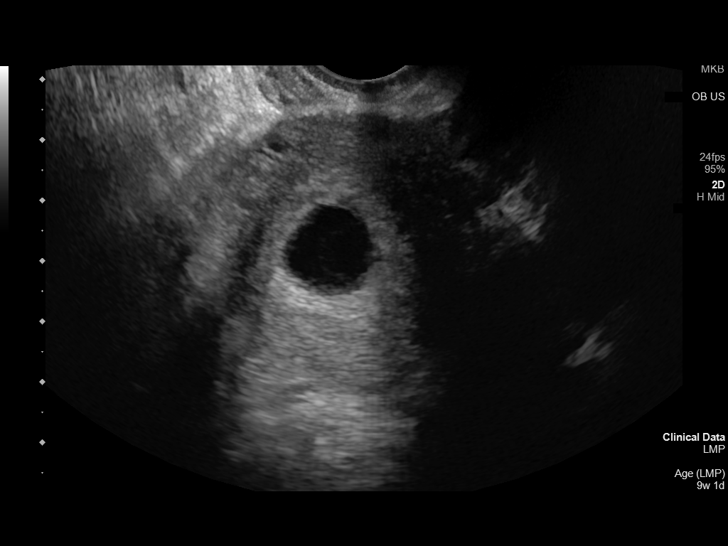

[14 of 28 positions shown; findings below may reference images not displayed]

FINDINGS: Intrauterine gestational sac: Single

Yolk sac:  Visualized.

Embryo:  Visualized.

Cardiac Activity: Visualized.

Heart Rate: 169 bpm

CRL: 16.1 mm   8 w   0 d                  US EDC: 12/09/2021

Subchorionic hemorrhage:  None visualized.

Maternal uterus/adnexae: Normal.
IMPRESSION: Single live intrauterine pregnancy corresponding to 8 weeks and 0
days gestation.

## 2022-04-03 IMAGING — US US OB COMP LESS 14 WK
1 series · 13 of 28 positions shown · non-contrast
Comparison: 04/29/2021

CLINICAL DATA: Vaginal bleeding.  Dropping quantitative beta HCG.

EXAM:
OBSTETRIC <14 WK US AND TRANSVAGINAL OB US
TECHNIQUE: Both transabdominal and transvaginal ultrasound examinations were
performed for complete evaluation of the gestation as well as the
maternal uterus, adnexal regions, and pelvic cul-de-sac.
Transvaginal technique was performed to assess early pregnancy.

[Series 1: us ob comp less 14 wks · 13 of 79 slices shown]
[im 3/79]
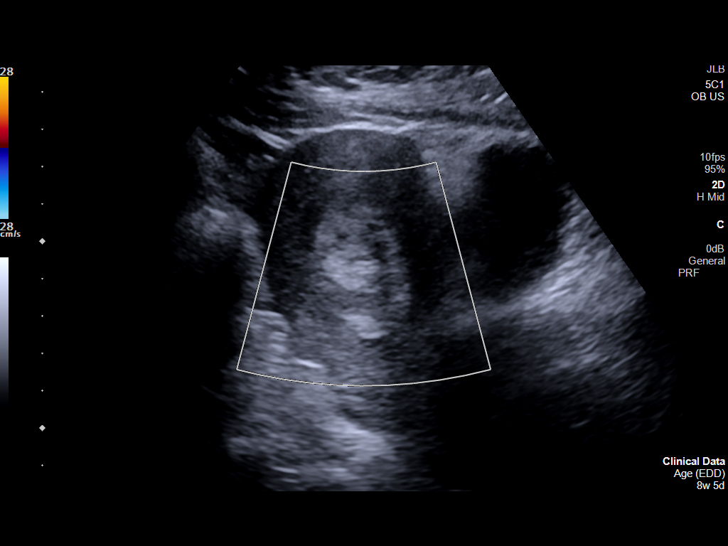
[im 9/79]
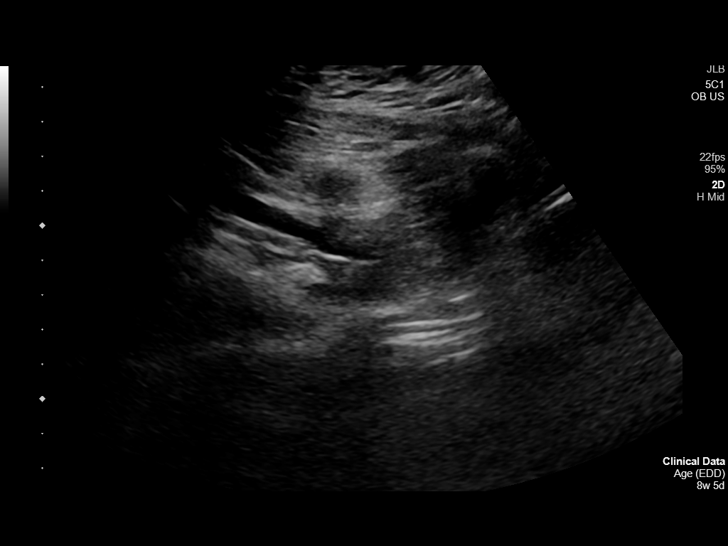
[im 15/79]
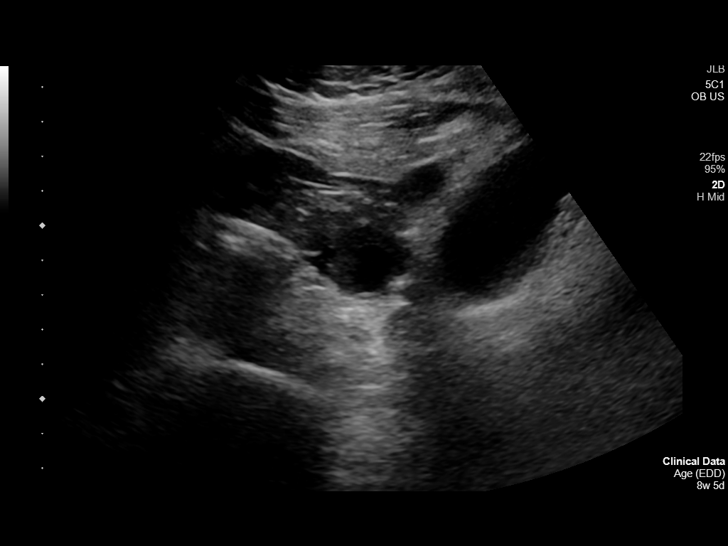
[im 21/79]
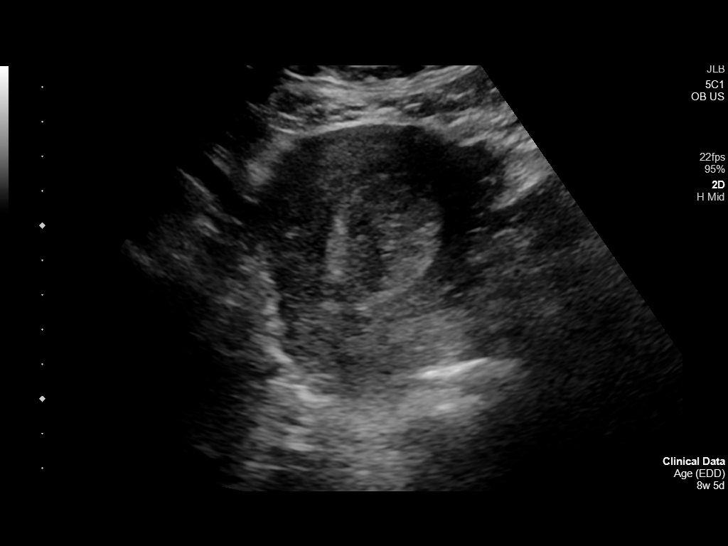
[im 27/79]
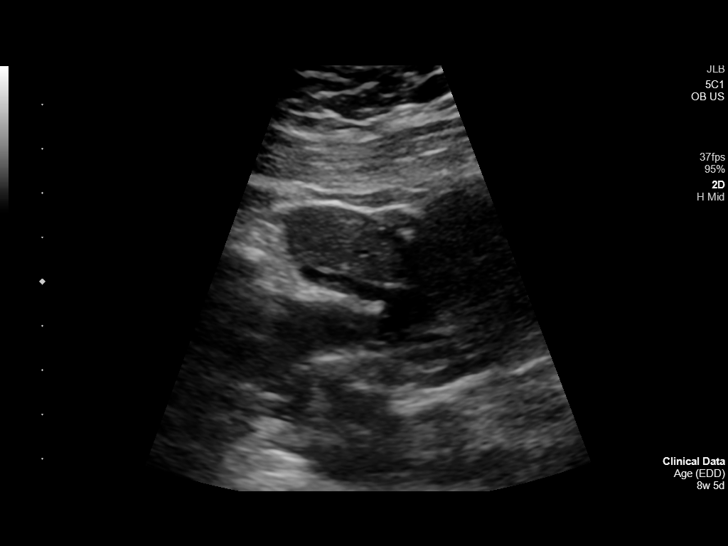
[im 32/79]
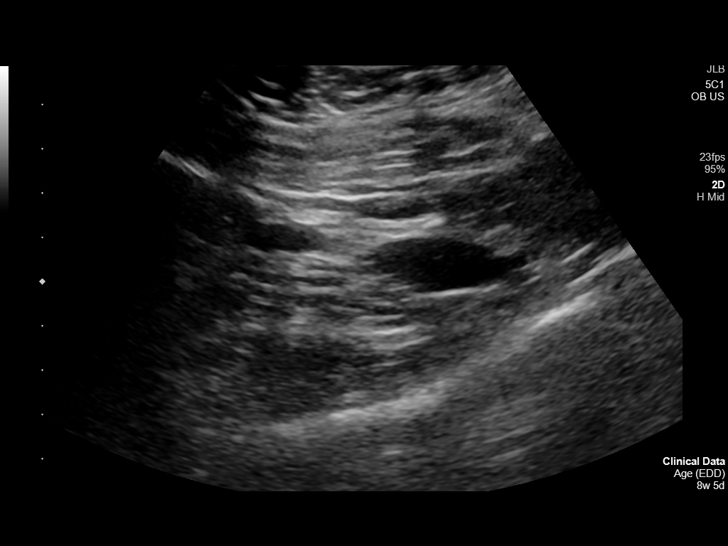
[im 41/79]
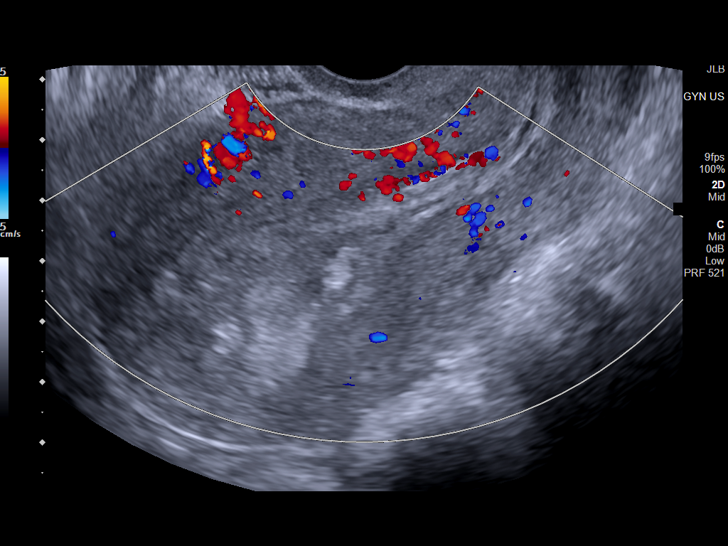
[im 47/79]
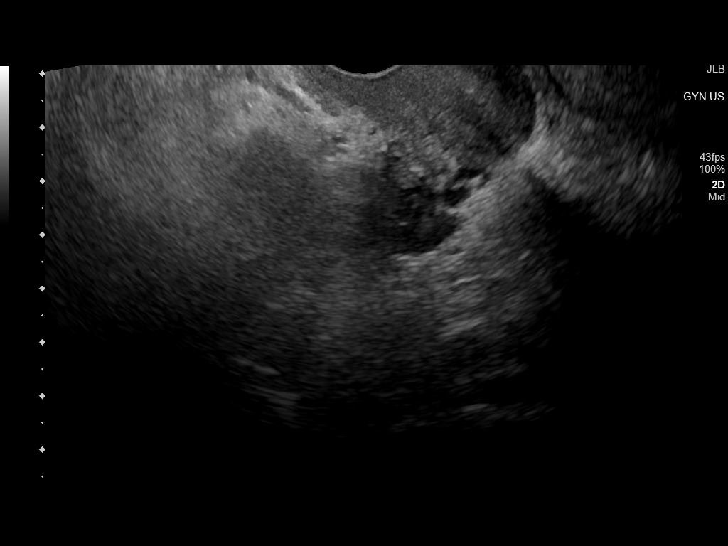
[im 53/79]
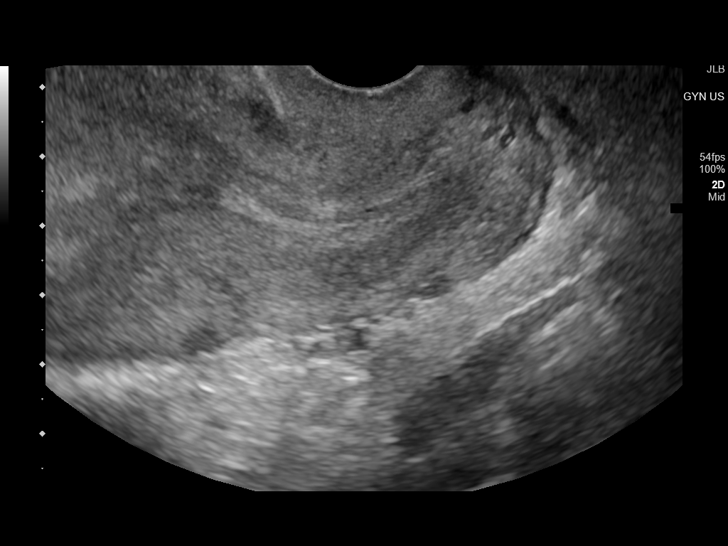
[im 58/79]
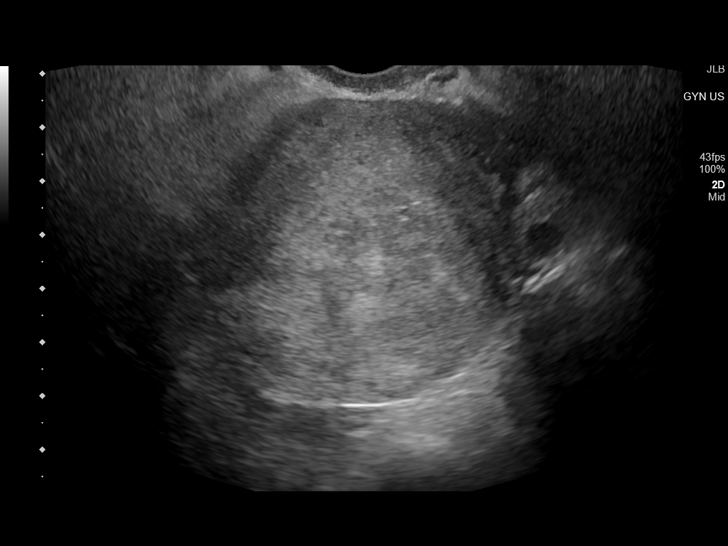
[im 64/79]
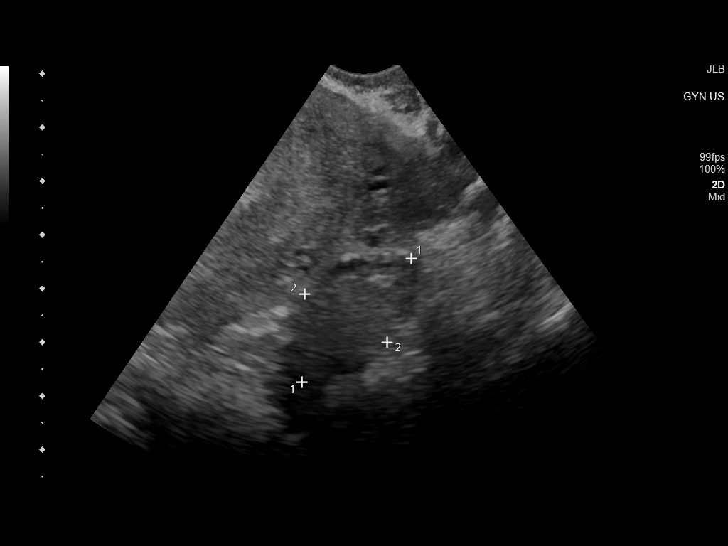
[im 70/79]
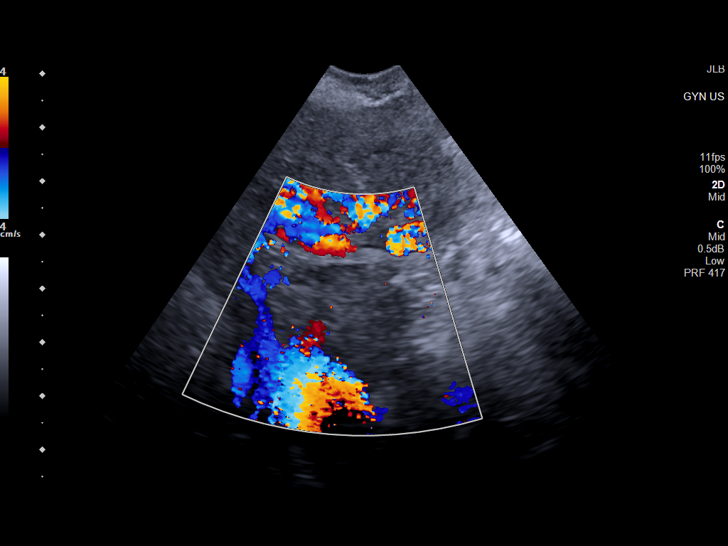
[im 76/79]
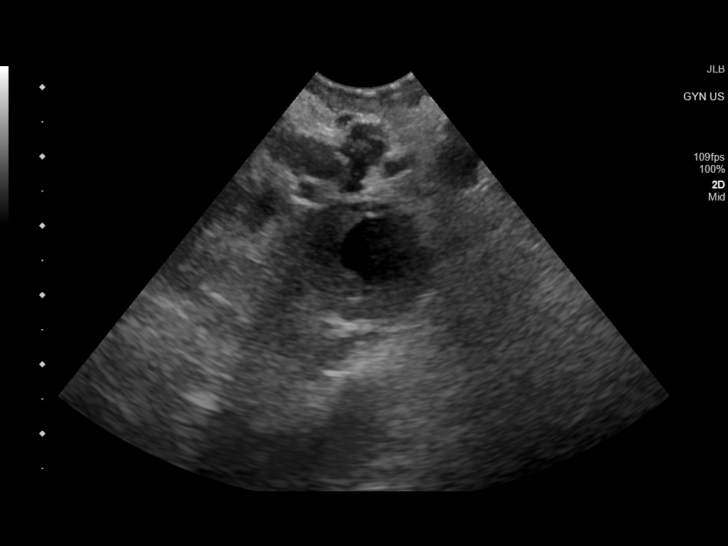

[13 of 28 positions shown; findings below may reference images not displayed]

FINDINGS: Intrauterine gestational sac: Absent

Yolk sac:  Absent

Embryo:  Absent

Cardiac Activity: N/A

Maternal uterus/adnexae: Thick heterogeneous material in the
endometrial canal measuring up to 3 cm in thickness likely
representing blood products, without appreciable internal color
flow. Approximately 1.2 cm corpus luteum in the left ovary, the
ovaries appear otherwise unremarkable.
IMPRESSION: 1. Absent intrauterine gestational sac compatible with pregnancy
loss.
2. Thick heterogeneous material along the endometrial canal without
visible internal Doppler flow, favoring blood products. This does
not have the overt masslike appearance of retained products of
conception; however, if abnormal bleeding persists or if
quantitative beta HCG fails to continue to drop off, thin imaging
reassessment would be suggested.

## 2022-04-09 DIAGNOSIS — R8290 Unspecified abnormal findings in urine: Secondary | ICD-10-CM | POA: Diagnosis not present

## 2022-04-09 DIAGNOSIS — R300 Dysuria: Secondary | ICD-10-CM | POA: Diagnosis not present

## 2022-04-10 DIAGNOSIS — Z118 Encounter for screening for other infectious and parasitic diseases: Secondary | ICD-10-CM | POA: Diagnosis not present

## 2022-04-10 DIAGNOSIS — R69 Illness, unspecified: Secondary | ICD-10-CM | POA: Diagnosis not present

## 2022-04-10 DIAGNOSIS — Z708 Other sex counseling: Secondary | ICD-10-CM | POA: Diagnosis not present

## 2022-04-10 DIAGNOSIS — R300 Dysuria: Secondary | ICD-10-CM | POA: Diagnosis not present

## 2022-08-22 ENCOUNTER — Encounter: Payer: Self-pay | Admitting: Advanced Practice Midwife

## 2022-08-22 ENCOUNTER — Ambulatory Visit: Payer: Self-pay | Admitting: Advanced Practice Midwife

## 2022-08-22 DIAGNOSIS — R87619 Unspecified abnormal cytological findings in specimens from cervix uteri: Secondary | ICD-10-CM

## 2022-08-22 DIAGNOSIS — K529 Noninfective gastroenteritis and colitis, unspecified: Secondary | ICD-10-CM

## 2022-08-22 DIAGNOSIS — Z113 Encounter for screening for infections with a predominantly sexual mode of transmission: Secondary | ICD-10-CM

## 2022-08-22 LAB — WET PREP FOR TRICH, YEAST, CLUE
Trichomonas Exam: NEGATIVE
Yeast Exam: NEGATIVE

## 2022-08-22 NOTE — Progress Notes (Signed)
Pt appointment for STD screening. Seen by Lesia Sago. Initial lab results reviewed with pt.

## 2022-08-22 NOTE — Progress Notes (Signed)
[facility] Department  STI clinic/screening visit [place] [address] [phone]  Subjective:  Regina Fischer is a 31 y.o. SBF nonsmoker G4P2 female being seen today for an STI screening visit. The patient reports they do have symptoms.  Patient reports that they do not desire a pregnancy in the next year.   They reported they are not interested in discussing contraception today.    Patient's last menstrual period was 08/13/2022 (exact date).   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     HPI  Patient reports c/o dysuria x 2 wks. Last sex 08/10/22 without condom; with current partner x 4 mo; 1 partner in last 3 mo. LMP 08/13/22. Last MJ 1 wk ago. Last ETOH 08/18/22 (2 glasses wine) 1x/wk.   Does the patient using douching products? No  Last HIV test per patient/review of record was No results found for: "HMHIVSCREEN" No results found for: "HIV" Patient reports last pap was  Lab Results  Component Value Date   DIAGPAP (A) 05/16/2021    - Atypical squamous cells of undetermined significance (ASC-US)     Screening for MPX risk: Does the patient have an unexplained rash? No Is the patient MSM? No Does the patient endorse multiple sex partners or anonymous sex partners? No Did the patient have close or sexual contact with a person diagnosed with MPX? No Has the patient traveled outside the [place] where MPX is endemic? No Is there a high clinical suspicion for MPX-- evidenced by one of the following No  -Unlikely to be chickenpox  -Lymphadenopathy  -Rash that present in same phase of evolution on any given body  part See flowsheet for further details and programmatic requirements.   Immunization history:  Immunization History  Administered Date(s) Administered   Hepatitis B 02/20/1993, 07/18/1993, 02/20/1994   MMR 02/20/1994, 02/27/1998   Tdap 06/20/2016   Varicella 02/27/1998     The following portions of the patient's history were reviewed and updated as appropriate: allergies, current medications, past medical history, past social history, past surgical history and problem list.  Objective:  There were no vitals filed for this visit.  Physical Exam Vitals and nursing note reviewed.  Constitutional:      Appearance: Normal appearance. She is obese.  HENT:     Head: Normocephalic and atraumatic.     Mouth/Throat:     Mouth: Mucous membranes are moist.     Pharynx: Oropharynx is clear. No oropharyngeal exudate or posterior oropharyngeal erythema.  Eyes:     Conjunctiva/sclera: Conjunctivae normal.  Pulmonary:     Effort: Pulmonary effort is normal.  Abdominal:     Palpations: Abdomen is soft. There is no mass.     Tenderness: There is no abdominal tenderness. There is no rebound.     Comments: Soft without masses or tenderness  Genitourinary:    General: Normal vulva.     Exam position: Lithotomy position.     Pubic Area: No rash or pubic lice.      Labia:        Right: No rash or lesion.        Left:  No rash or lesion.      Vagina: Vaginal discharge (white creamy leukorrhea, ph<4.5) present. No erythema, bleeding or lesions.     Cervix: Normal.     Uterus: Normal.      Adnexa: Right adnexa normal and left adnexa normal.     Rectum: Normal.     Comments: pH = <4.5 Lymphadenopathy:     Head:     Right side of head: No preauricular or posterior auricular adenopathy.     Left side of head: No preauricular or posterior auricular adenopathy.     Cervical: No cervical adenopathy.     Right cervical: No superficial, deep or posterior cervical adenopathy.    Left cervical: No  superficial, deep or posterior cervical adenopathy.     Upper Body:     Right upper body: No supraclavicular, axillary or epitrochlear adenopathy.     Left upper body: No supraclavicular, axillary or epitrochlear adenopathy.     Lower Body: No right inguinal adenopathy. No left inguinal adenopathy.  Skin:    General: Skin is warm and dry.     Findings: No rash.  Neurological:     Mental Status: She is alert and oriented to person, place, and time.     Assessment and Plan:  SINEAD HOCKMAN is a 31 y.o. female presenting to the [facility] Department for STI screening  1. Screening examination for venereal disease Treat wet mount per standing orders Immunization nurse consult Referred to primary care MD for dysuria sxs  - WET PREP FOR [place], YEAST, [facility]  2. Abnormal cervical Papanicolaou smear, unspecified abnormal pap finding Referred back to [facility] for f/u ASCUS 05/16/21  3. 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  No future appointments.  Herbie Saxon, CNM

## 2022-08-27 LAB — GONOCOCCUS CULTURE

## 2023-02-21 ENCOUNTER — Emergency Department: Payer: BLUE CROSS/BLUE SHIELD

## 2023-02-21 ENCOUNTER — Emergency Department
Admission: EM | Admit: 2023-02-21 | Discharge: 2023-02-22 | Disposition: A | Discharge status: Home / Self Care | Payer: BLUE CROSS/BLUE SHIELD | Attending: Emergency Medicine | Admitting: Emergency Medicine

## 2023-02-21 DIAGNOSIS — R000 Tachycardia, unspecified: Secondary | ICD-10-CM | POA: Insufficient documentation

## 2023-02-21 DIAGNOSIS — F419 Anxiety disorder, unspecified: Secondary | ICD-10-CM | POA: Diagnosis present

## 2023-02-21 DIAGNOSIS — F12920 Cannabis use, unspecified with intoxication, uncomplicated: Secondary | ICD-10-CM | POA: Diagnosis not present

## 2023-02-21 DIAGNOSIS — F12929 Cannabis use, unspecified with intoxication, unspecified: Secondary | ICD-10-CM

## 2023-02-21 DIAGNOSIS — F1290 Cannabis use, unspecified, uncomplicated: Secondary | ICD-10-CM

## 2023-02-21 LAB — CBC WITH DIFFERENTIAL/PLATELET
Abs Immature Granulocytes: 0 10*3/uL (ref 0.00–0.07)
Basophils Absolute: 0 10*3/uL (ref 0.0–0.1)
Basophils Relative: 1 %
Eosinophils Absolute: 0 10*3/uL (ref 0.0–0.5)
Eosinophils Relative: 1 %
HCT: 41.8 % (ref 36.0–46.0)
Hemoglobin: 13.6 g/dL (ref 12.0–15.0)
Immature Granulocytes: 0 %
Lymphocytes Relative: 64 %
Lymphs Abs: 3.8 10*3/uL (ref 0.7–4.0)
MCH: 28.8 pg (ref 26.0–34.0)
MCHC: 32.5 g/dL (ref 30.0–36.0)
MCV: 88.4 fL (ref 80.0–100.0)
Monocytes Absolute: 0.4 10*3/uL (ref 0.1–1.0)
Monocytes Relative: 7 %
Neutro Abs: 1.6 10*3/uL — ABNORMAL LOW (ref 1.7–7.7)
Neutrophils Relative %: 27 %
Platelets: 165 10*3/uL (ref 150–400)
RBC: 4.73 MIL/uL (ref 3.87–5.11)
RDW: 12.2 % (ref 11.5–15.5)
WBC: 5.9 10*3/uL (ref 4.0–10.5)
nRBC: 0 % (ref 0.0–0.2)

## 2023-02-21 LAB — COMPREHENSIVE METABOLIC PANEL
ALT: 28 U/L (ref 0–44)
AST: 35 U/L (ref 15–41)
Albumin: 4.5 g/dL (ref 3.5–5.0)
Alkaline Phosphatase: 69 U/L (ref 38–126)
Anion gap: 16 — ABNORMAL HIGH (ref 5–15)
BUN: 14 mg/dL (ref 6–20)
CO2: 20 mmol/L — ABNORMAL LOW (ref 22–32)
Calcium: 9 mg/dL (ref 8.9–10.3)
Chloride: 102 mmol/L (ref 98–111)
Creatinine, Ser: 0.77 mg/dL (ref 0.44–1.00)
GFR, Estimated: >60 mL/min (ref >60)
Glucose, Bld: 166 mg/dL — ABNORMAL HIGH (ref 70–99)
Potassium: 3.1 mmol/L — ABNORMAL LOW (ref 3.5–5.1)
Sodium: 138 mmol/L (ref 135–145)
Total Bilirubin: 1.2 mg/dL (ref 0.3–1.2)
Total Protein: 8 g/dL (ref 6.5–8.1)

## 2023-02-21 LAB — URINALYSIS, ROUTINE W REFLEX MICROSCOPIC
Bacteria, UA: NONE SEEN
Bilirubin Urine: NEGATIVE
Glucose, UA: NEGATIVE mg/dL
Hgb urine dipstick: NEGATIVE
Ketones, ur: 5 mg/dL — AB
Nitrite: NEGATIVE
Protein, ur: NEGATIVE mg/dL
Specific Gravity, Urine: 1.031 — ABNORMAL HIGH (ref 1.005–1.030)
pH: 5 (ref 5.0–8.0)

## 2023-02-21 LAB — URINE DRUG SCREEN, QUALITATIVE (ARMC ONLY)
Amphetamines, Ur Screen: NOT DETECTED
Barbiturates, Ur Screen: NOT DETECTED
Benzodiazepine, Ur Scrn: NOT DETECTED
Cannabinoid 50 Ng, Ur ~~LOC~~: NOT DETECTED
Cocaine Metabolite,Ur ~~LOC~~: NOT DETECTED
MDMA (Ecstasy)Ur Screen: NOT DETECTED
Methadone Scn, Ur: NOT DETECTED
Opiate, Ur Screen: NOT DETECTED
Phencyclidine (PCP) Ur S: NOT DETECTED
Tricyclic, Ur Screen: NOT DETECTED

## 2023-02-21 LAB — ETHANOL: Alcohol, Ethyl (B): <10 mg/dL (ref <10)

## 2023-02-21 MED ORDER — LORAZEPAM 2 MG/ML IJ SOLN
2.0000 mg | Freq: Once | INTRAMUSCULAR | Status: AC
  Administered 2023-02-21: 2 mg via INTRAVENOUS
  Filled 2023-02-21: qty 1

## 2023-02-21 MED ORDER — POTASSIUM CHLORIDE CRYS ER 20 MEQ PO TBCR
40.0000 meq | EXTENDED_RELEASE_TABLET | Freq: Once | ORAL | Status: AC
  Administered 2023-02-22: 40 meq via ORAL
  Filled 2023-02-21: qty 2

## 2023-02-21 MED ORDER — SODIUM CHLORIDE 0.9 % IV BOLUS
1000.0000 mL | Freq: Once | INTRAVENOUS | Status: AC
  Administered 2023-02-21: 1000 mL via INTRAVENOUS

## 2023-02-21 MED ORDER — ONDANSETRON HCL 4 MG/2ML IJ SOLN
4.0000 mg | Freq: Once | INTRAMUSCULAR | Status: AC
  Administered 2023-02-21: 4 mg via INTRAVENOUS
  Filled 2023-02-21: qty 2

## 2023-02-21 NOTE — ED Notes (Signed)
ED Provider at bedside. 
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
## 2023-02-21 NOTE — ED Triage Notes (Signed)
Pt presents to triage via [facility] from home with complaints of ingestion of an edible around 1900. She notes having sx around 2100 where she felt "weird". Pt believed to have seizure like activity - non witnessed by EMS personnel and patient was A&O for the duration of the visit. Denies CP or SOB.

## 2023-02-21 NOTE — ED Triage Notes (Signed)
First Nurse note: pt BIB ems from home.  Pt reportedly ate edible around 1900 tonight, and started to feel like she couldn't feel her body.  Pt reports she thinks she had a seizure.  HR in the 140s per ems.  No witnessed seizure activity per ems.

## 2023-02-22 MED ORDER — SODIUM CHLORIDE 0.9 % IV BOLUS
1000.0000 mL | Freq: Once | INTRAVENOUS | Status: AC
  Administered 2023-02-22: 1000 mL via INTRAVENOUS

## 2023-02-22 NOTE — ED Notes (Signed)
Discussed discharge information with the patients support person. Will discuss the information with the patient once she is more awake following the administration of ativan. VSS.

## 2023-02-22 NOTE — Discharge Instructions (Addendum)
Avoid ingesting edibles.  Drink plenty of alcoholic fluids this weekend.  Return to the ER for worsening symptoms, persistent vomiting, difficulty breathing or other concerns.

## 2023-02-22 NOTE — ED Provider Notes (Signed)
-----------------------------------------   1:27 AM on 02/22/2023 -----------------------------------------   Patient awakens to verbal.  Pulse rate 97.  SBP 89.  Will administer second liter IV fluids.   ----------------------------------------- 3:29 AM on 02/22/2023 -----------------------------------------   Heart rate and blood pressure remained stable.  Patient states she feels drunk but she ambulated to commode with steady gait.  Advised her to avoid further edibles in the future.  Strict return precautions given.  Patient and friend verbalized understanding agree with plan of care.   ----------------------------------------- 6:35 AM on 02/22/2023 -----------------------------------------   Patient fell back asleep.  She awakened easily, drink some water, ambulated with steady gait and was discharged home with family member in good and stable condition.   Irean Hong, MD 02/22/23 [phone]
